# Patient Record
Sex: Male | Born: 1990 | Race: White | Hispanic: No | Marital: Single | Smoking: Former smoker
Health system: Southern US, Community
[De-identification: ages and names within clinical notes are randomized; demographics above are authoritative.]

## PROBLEM LIST (undated history)

## (undated) DIAGNOSIS — F419 Anxiety disorder, unspecified: Secondary | ICD-10-CM

## (undated) DIAGNOSIS — E785 Hyperlipidemia, unspecified: Secondary | ICD-10-CM

## (undated) HISTORY — DX: Anxiety disorder, unspecified: F41.9

## (undated) HISTORY — DX: Hyperlipidemia, unspecified: E78.5

---
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
## 2011-01-14 ENCOUNTER — Ambulatory Visit (HOSPITAL_COMMUNITY)
Admission: RE | Admit: 2011-01-14 | Discharge: 2011-01-14 | Disposition: A | Discharge status: Home / Self Care | Payer: Medicaid Other | Source: Ambulatory Visit | Attending: Family Medicine | Admitting: Family Medicine

## 2011-01-14 ENCOUNTER — Other Ambulatory Visit (HOSPITAL_COMMUNITY): Payer: Self-pay | Admitting: Family Medicine

## 2011-01-14 DIAGNOSIS — M545 Low back pain, unspecified: Secondary | ICD-10-CM | POA: Insufficient documentation

## 2011-03-20 ENCOUNTER — Telehealth: Payer: Self-pay | Admitting: Orthopedic Surgery

## 2011-03-20 ENCOUNTER — Emergency Department (HOSPITAL_COMMUNITY)
Admission: EM | Admit: 2011-03-20 | Discharge: 2011-03-20 | Disposition: A | Discharge status: Home / Self Care | Payer: Medicaid Other | Attending: Emergency Medicine | Admitting: Emergency Medicine

## 2011-03-20 ENCOUNTER — Emergency Department (HOSPITAL_COMMUNITY): Payer: Medicaid Other

## 2011-03-20 ENCOUNTER — Encounter: Payer: Self-pay | Admitting: *Deleted

## 2011-03-20 DIAGNOSIS — S43109A Unspecified dislocation of unspecified acromioclavicular joint, initial encounter: Secondary | ICD-10-CM | POA: Insufficient documentation

## 2011-03-20 DIAGNOSIS — F17200 Nicotine dependence, unspecified, uncomplicated: Secondary | ICD-10-CM | POA: Insufficient documentation

## 2011-03-20 MED ORDER — NAPROXEN 500 MG PO TABS: 500.0000 mg | ORAL_TABLET | Freq: Two times a day (BID) | ORAL | Status: AC

## 2011-03-20 NOTE — Telephone Encounter (Signed)
Patient and mother called to request appointment for evaluation of shoulder injury per visit at [facility] Emergency Room last evening.  Advised about insurance referral requirement per patient's insurance.  She contacted PCP Dr. Janna Arch, who has recommended follow up in their office first and referral can be made at that time.  Mom [phone].  Advised we will schedule first available upon receipt of referral.

## 2011-03-20 NOTE — ED Notes (Addendum)
Pt reports falling today on right shoulder.  Reports increasing pain through evening. Reporting some numbness.  Pt guarding shoulder and ROM decreased.

## 2011-03-20 NOTE — ED Notes (Signed)
Pt left the er stating no needs 

## 2011-03-20 NOTE — ED Provider Notes (Signed)
History     CSN: [id number] Arrival date & time: 03/20/2011  1:27 AM   None     Chief Complaint  Patient presents with  . Shoulder Pain    (Consider location/radiation/quality/duration/timing/severity/associated sxs/prior treatment) HPI Comments: Patient is a 20 year old male with a history of clavicle fracture on the right. He presents after a fall from his skateboard approximately 10 hours prior to arrival. He landed directly on the lateral surface of his right shoulder and has had constant pain in the shoulder since that time. Pain is ongoing, worse with palpation or range of motion of the shoulder, not associated with numbness weakness or change in color of his hand on the right. He had ibuprofen just prior to arrival without improvement.  Patient is a 20 y.o. male presenting with shoulder pain. The history is provided by the patient and a parent.  Shoulder Pain    History reviewed. No pertinent past medical history.  History reviewed. No pertinent past surgical history.  History reviewed. No pertinent family history.  History  Substance Use Topics  . Smoking status: Current Everyday Smoker -- 0.5 packs/day  . Smokeless tobacco: Not on file  . Alcohol Use: No      Review of Systems  Constitutional: Negative for fever.  Gastrointestinal: Negative for vomiting.  Skin: Negative for color change, rash and wound.       Laceration  Neurological: Negative for weakness and numbness.    Allergies  Amoxicillin and Penicillins  Home Medications   Current Outpatient Rx  Name Route Sig Dispense Refill  . NAPROXEN 500 MG PO TABS Oral Take 1 tablet (500 mg total) by mouth 2 (two) times daily. 30 tablet 0    BP 139/79  Pulse 89  Temp 98.6 F (37 C)  Resp 18  Ht 6' (1.829 m)  Wt 225 lb (102.059 kg)  BMI 30.52 kg/m2  SpO2 100%  Physical Exam  Nursing note and vitals reviewed. Constitutional: He appears well-developed and well-nourished. No distress.  HENT:  Head:  Normocephalic and atraumatic.  Eyes: Conjunctivae are normal. No scleral icterus.  Cardiovascular: Normal rate, regular rhythm and intact distal pulses.   Pulmonary/Chest: Effort normal and breath sounds normal.  Musculoskeletal: He exhibits tenderness ( Over the a.c. joint on the right. Decreased range of motion of the right shoulder secondary to pain at this location.). He exhibits no edema.       No palpable tenderness over the clavicle, lateral shoulder.  Neurological: He is alert.  Skin: Skin is warm and dry. No rash noted. He is not diaphoretic.    ED Course  Procedures (including critical care time)  Labs Reviewed - No data to display Dg Shoulder Right  03/20/2011  *RADIOLOGY REPORT*  Clinical Data: Fall  RIGHT SHOULDER - 2+ VIEW  Comparison: 12/03/2007  Findings: Fracture in the mid right clavicle seen on the prior study has healed with mild deformity.  No acute fracture.  Widening of the [facility] joints suggesting grade 1 AC separation.  This was not present previously.  IMPRESSION: Chronic healed fracture of the clavicle  Grade 1 AC separation.  Original Report Authenticated By: Camelia Phenes, M.D.     1. Separation of AC joint, type 1       MDM  Exam consistent with separated a.c. joint, imaging confirm this diagnosis, sling placed in the emergency department. Rice therapy prescribed for home.        Vida Roller, MD 03/20/11 [phone]

## 2011-03-27 ENCOUNTER — Encounter: Payer: Self-pay | Admitting: Orthopedic Surgery

## 2011-03-27 ENCOUNTER — Ambulatory Visit (INDEPENDENT_AMBULATORY_CARE_PROVIDER_SITE_OTHER): Payer: Medicaid Other | Admitting: Orthopedic Surgery

## 2011-03-27 VITALS — BP 120/70 | Ht 72.0 in | Wt 225.0 lb

## 2011-03-27 DIAGNOSIS — S43109A Unspecified dislocation of unspecified acromioclavicular joint, initial encounter: Secondary | ICD-10-CM

## 2011-03-27 MED ORDER — IBUPROFEN 800 MG PO TABS: 800.0000 mg | ORAL_TABLET | Freq: Three times a day (TID) | ORAL | Status: AC | PRN

## 2011-03-27 NOTE — Progress Notes (Signed)
Chief complaint pain, RIGHT shoulder.  Referral from the emergency room.  History 20 year old male fell on his RIGHT shoulder skateboarding complains of pain over the RIGHT shoulder.  Review of systems and wound negative except for joint pain.  Past history no medical problems, and no surgeries.  Exam normal development drumming and hygiene, normal vital signs.  Patient awake, alert, and oriented x3. Mood and affect. Normal.  RIGHT shoulder.  Tenderness over the a.c. Joint decreased range of motion and inability and ability to reach across the chest.  Reflexes are normal. Skin is intact. No major deformities good pulse in the RIGHT hand.  X-ray shows really no evidence of injury. Clinical diagnosis of a.c. Separation type I.  Immobilization for 10-12 days.  I gave him Codman exercises to start on Monday. We've stopped her Naprosyn put him on ibuprofen. Follow up as needed. He should continue to do his exercises until his range of motion returns

## 2011-03-27 NOTE — Patient Instructions (Signed)
Start exercises Monday

## 2012-05-17 ENCOUNTER — Emergency Department (HOSPITAL_COMMUNITY)
Admission: EM | Admit: 2012-05-17 | Discharge: 2012-05-17 | Disposition: A | Discharge status: Home / Self Care | Payer: Self-pay | Attending: Emergency Medicine | Admitting: Emergency Medicine

## 2012-05-17 ENCOUNTER — Encounter (HOSPITAL_COMMUNITY): Payer: Self-pay

## 2012-05-17 DIAGNOSIS — B349 Viral infection, unspecified: Secondary | ICD-10-CM

## 2012-05-17 DIAGNOSIS — R630 Anorexia: Secondary | ICD-10-CM | POA: Insufficient documentation

## 2012-05-17 DIAGNOSIS — J3489 Other specified disorders of nose and nasal sinuses: Secondary | ICD-10-CM | POA: Insufficient documentation

## 2012-05-17 DIAGNOSIS — B9789 Other viral agents as the cause of diseases classified elsewhere: Secondary | ICD-10-CM | POA: Insufficient documentation

## 2012-05-17 DIAGNOSIS — R5383 Other fatigue: Secondary | ICD-10-CM | POA: Insufficient documentation

## 2012-05-17 DIAGNOSIS — R51 Headache: Secondary | ICD-10-CM | POA: Insufficient documentation

## 2012-05-17 DIAGNOSIS — R5381 Other malaise: Secondary | ICD-10-CM | POA: Insufficient documentation

## 2012-05-17 DIAGNOSIS — R05 Cough: Secondary | ICD-10-CM | POA: Insufficient documentation

## 2012-05-17 DIAGNOSIS — R059 Cough, unspecified: Secondary | ICD-10-CM | POA: Insufficient documentation

## 2012-05-17 MED ORDER — OXYCODONE-ACETAMINOPHEN 5-325 MG PO TABS: 1.0000 | ORAL_TABLET | Freq: Four times a day (QID) | ORAL | Status: DC | PRN

## 2012-05-17 MED ORDER — PROMETHAZINE HCL 25 MG PO TABS: 25.0000 mg | ORAL_TABLET | Freq: Four times a day (QID) | ORAL | Status: DC | PRN

## 2012-05-17 NOTE — ED Provider Notes (Signed)
History     CSN: [id number]  Arrival date & time 05/17/12  1908   First MD Initiated Contact with Patient 05/17/12 2020      Chief Complaint  Patient presents with  . Sore Throat  . Headache  . Nasal Congestion    (Consider location/radiation/quality/duration/timing/severity/associated sxs/prior treatment) Patient is a 21 y.o. male presenting with pharyngitis and headaches. The history is provided by the patient.  Sore Throat This is a new problem. Associated symptoms include headaches. Pertinent negatives include no chest pain, no abdominal pain and no shortness of breath.  Headache  Pertinent negatives include no shortness of breath, no nausea and no vomiting.   patient has had sore throat cough and myalgias with headache for the last 2 days. He's had decreased appetite. She's had chills. He's had no numbness or weakness. He has had some mild nausea and some loose stools, but not frank diarrhea. Is no abdominal pain. He is otherwise healthy. He's had no hemoptysis with cough. He has had some yellowish sputum. Headache is dull and throbbing. No confusion.  History reviewed. No pertinent past medical history.  History reviewed. No pertinent past surgical history.  No family history on file.  History  Substance Use Topics  . Smoking status: Current Every Day Smoker -- 0.5 packs/day  . Smokeless tobacco: Not on file  . Alcohol Use: No      Review of Systems  Constitutional: Positive for appetite change and fatigue. Negative for activity change.  HENT: Positive for congestion and sore throat. Negative for neck pain and neck stiffness.   Eyes: Negative for pain.  Respiratory: Positive for cough. Negative for chest tightness and shortness of breath.   Cardiovascular: Negative for chest pain and leg swelling.  Gastrointestinal: Negative for nausea, vomiting, abdominal pain and diarrhea.  Genitourinary: Negative for flank pain.  Musculoskeletal: Negative for back pain.    Skin: Negative for rash.  Neurological: Positive for headaches. Negative for weakness and numbness.  Psychiatric/Behavioral: Negative for behavioral problems.    Allergies  Amoxicillin; Penicillins; and Latex  Home Medications   Current Outpatient Rx  Name  Route  Sig  Dispense  Refill  . TYLENOL COLD/FLU SEVERE DAY PO   Oral   Take 15-30 mLs by mouth daily as needed. For symptoms         . OXYCODONE-ACETAMINOPHEN 5-325 MG PO TABS   Oral   Take 1-2 tablets by mouth every 6 (six) hours as needed for pain.   15 tablet   0   . PROMETHAZINE HCL 25 MG PO TABS   Oral   Take 1 tablet (25 mg total) by mouth every 6 (six) hours as needed for nausea.   15 tablet   0     BP 153/84  Pulse 117  Temp 100.2 F (37.9 C) (Oral)  Resp 18  Ht 6\' 1"  (1.854 m)  Wt 210 lb (95.255 kg)  BMI 27.71 kg/m2  SpO2 100%  Physical Exam  Nursing note and vitals reviewed. Constitutional: He is oriented to person, place, and time. He appears well-developed and well-nourished.  HENT:  Head: Normocephalic and atraumatic.  Mouth/Throat: No oropharyngeal exudate.       Mild posterior pharyngeal erythema without exudate. Uvula is midline.  Eyes: Conjunctivae normal are normal.  Neck: Normal range of motion. Neck supple.  Cardiovascular: Regular rhythm and normal heart sounds.   No murmur heard.      Mild tachycardia  Pulmonary/Chest: Effort normal and breath sounds normal.  Abdominal: Soft. Bowel sounds are normal. He exhibits no distension and no mass. There is no tenderness. There is no rebound and no guarding.  Musculoskeletal: Normal range of motion. He exhibits no edema.  Lymphadenopathy:    He has cervical adenopathy.  Neurological: He is alert and oriented to person, place, and time. No cranial nerve deficit.  Skin: Skin is warm and dry.  Psychiatric: He has a normal mood and affect.    ED Course  Procedures (including critical care time)  Labs Reviewed - No data to display No  results found.   1. Viral infection       MDM  Patient presents with URI/viral symptoms for the last couple days. He has a tachycardia, but maintain blood pressure. He has a low-grade temperature of 100.2. Patient was offered IV fluids to improve the way he feels. He states he did not want this. He has a likely viral syndrome. He does not appear to be severely ill at this time. He was discharged home with pain medications and some nausea medications. He'll followup as needed. He was given a work note that he requested.        Juliet Rude. Rubin Payor, MD 05/17/12 2052

## 2012-05-17 NOTE — ED Notes (Signed)
Pt with cold like symptoms for 2 days. C/o sore throat, headache, and congestion for 2 days

## 2016-05-14 NOTE — H&P (Signed)
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 Malvin [name], after which the wound was left open and allowed to heal with daily packing changes. Patient previously noted recurrence of a tiny amount of clear drainage from the same area over the several weeks prior to his initial appointment. At that time, he denied any pain or fever/chills and describes only scant clear drainage, and he stated that it "doesn't really bother" him, but he requested referral to make sure it didn't require re-excision. Since that time, he says the scant drainage has increased somewhat, it's been bothering him more, and he'd like to have it re-excised.  Review of Symptoms:  Constitutional:No fevers, chills, or unexplained weight loss Head:Atraumatic; no masses; no abnormalities Eyes:No visual changes or eye pain Cardiovascular: No chest pain or palpitations.  Respiratory:No cough, shortness of breath or wheezing  Gastrointestin: No diarrhea, constipation, blood in stools, abdominal pain, vomiting or heartburn Musculoskeletal:No arthalgias, myalgias or joint swelling Integumentary: Recurrent NT sacrococcygeal minimally draining sinus as per HPI    Past Medical History:Reviewed  Past Medical History  Surgical History:  in-office excision of symptomatic (painful) and draining non-infected pilonidal cyst Derek [name]([place], 12/2014) Medical Problems:  pilonidal cyst Allergies:   seasonal, PCN Medications:   Chantix   Social History:Reviewed  Social History  Preferred Language: English Race:  White Ethnicity: Not Hispanic / Latino Age: [age] year Marital Status:  M Alcohol:  denies  Smoking Status: Former smoker reviewed on 05/14/2016 Started Date: 02/26/2006 Stopped Date:  01/18/2016  Functional Status ------------------------------------------------ Bathing: Normal Cooking: Normal Dressing: Normal Driving: Normal Eating: Normal Managing Meds: Normal Oral Care: Normal Shopping: Normal Toileting: Normal Transferring: Normal Walking: Normal  Cognitive Status ------------------------------------------------ Attention: Normal Decision Making: Normal Language: Normal Memory: Normal Motor: Normal Perception: Normal Problem Solving: Normal Visual and Spatial: Normal   Family History:Reviewed  Family Health History Mother, Living; Obesity;  Father, unknown  Vital Signs as of 05/14/2016:  Systolic 145: Diastolic 89: Heart Rate 86: Temp 36.5C (Temporal) Height 185CM: Weight 113.4KG: BMI 32.98 kg/m2  Physical Exam: General:Well-appearing, obese, well-nourished in no distress Skin: currently non-draining, NT 2 cm vertical length x 1 cm horizontal width sacrococcygeal pilonidal cyst with what appears to be 2-3 cm inferiorly a punctate non-draining sinus tract without erythema or purulent drainage, no rash or other prominent lesions Head:Atraumatic; no masses; no abnormalities Eyes:conjunctiva clear, EOM intact, PERRL Heart:RRR, no murmur Lungs:CTA bilaterally, no wheezes, rhonchi, rales.  Breathing unlabored. Abdomen: Soft, NT/ND, no HSM, no masses. Extremities:No deformities, clubbing, cyanosis, or edema.   Assessment: 25 year old obese Male with symptomatic (draining) recurrent non-infected 2 cm vertical length x 1 cm horizontal width sacrococcygeal pilonidal cyst with what appears to be 2-3 cm inferiorly a punctate non-draining sinus tract 1 year s/p excision by another surgeon.  Plan:      - all risks, benefits, and alternatives to re-excision of pilonidal cyst and sinus tract discussed with patient (and his mother, also present), all of patient's questions were answered to his expressed satisfaction, patient agrees to proceed  with surgery, and informed consent was accordingly obtained      - will apply wound vac at time of surgery, and also discussed with patient was that he may choose alternatively to change daily wet-to-dry dressings      - patient scheduled for elective re-excision of  symptomatic and draining recurrent sacrococcygeal pilonidal cyst and sinus tract      - surgical follow-up 2 weeks after planned procedure  -- Derek CowerJason E. Earlene Plateravis, MD, RPVI [place]: [facility] Office #: [phone]

## 2016-05-16 NOTE — Patient Instructions (Signed)
Derek [name]  05/16/2016     @PREFPERIOPPHARMACY @   Your procedure is scheduled on  05/23/2016   Report to [name] at  615  A.M.  Call this number if you have problems the morning of surgery:  [phone]   Remember:  Do not eat food or drink liquids after midnight.  Take these medicines the morning of surgery with A SIP OF WATER  none   Do not wear jewelry, make-up or nail polish.  Do not wear lotions, powders, or perfumes, or deoderant.  Do not shave 48 hours prior to surgery.  Men may shave face and neck.  Do not bring valuables to the hospital.  [facility] is not responsible for any belongings or valuables.  Contacts, dentures or bridgework may not be worn into surgery.  Leave your suitcase in the car.  After surgery it may be brought to your room.  For patients admitted to the hospital, discharge time will be determined by your treatment team.  Patients discharged the day of surgery will not be allowed to drive home.   Name and phone number of your driver:   family Special instructions:  none  Please read over the following fact sheets that you were given. Anesthesia Post-op Instructions and Care and Recovery After Surgery      Vacuum-Assisted Closure Therapy Vacuum-assisted closure (VAC) therapy uses a device that removes fluid and germs from wounds to help them heal. It is used on wounds that cannot be closed with stitches. They often heal slowly. Vacuum-assisted therapy helps the wound stay clean and healthy while the open wound slowly grows back together. Vacuum-assisted closure therapy uses a bandage (dressing) that is made of foam. It is put inside the wound. Then, a drape is placed over the wound. This drape sticks to your skin to keep air out, and to protect the wound. A tube is hooked up to a small pump and is attached to the drape. The pump sucks out the fluid and germs. Vacuum-assisted closure therapy can also help reduce the bad  smell that comes from the wound. HOW DOES IT WORK?  The vacuum pump pulls fluid through the foam dressing. The dressing may wrinkle during this process. The fluid goes into the tube and away from the wound. The fluid then goes into a container. The fluid in the container must be replaced if it is full or at least once a week, even if the container is not full. The pulling from the pump helps to close the wound and bring better circulation to the wound area. The foam dressing covers and protects the wound. It helps your wound heal faster.  HOW DOES IT FEEL?   You might feel a little pulling when the pump is on.  You might also feel a mild vibrating sensation.  You might feel some discomfort when the dressing is taken off. CAN I MOVE AROUND WITH VACUUM-ASSISTED CLOSURE THERAPY? Yes, it has a backup battery which is used when the machine is not plugged in, as long as the battery is working, you can move freely. WHAT ARE SOME THINGS I MUST KNOW?  Do not turn off the pump yourself, unless instructed to do so by your healthcare provider, such as for bathing.  Do not take off the dressing yourself, unless instructed to do so by your caregiver.  You can wash or shower with the dressing. However, do not take the  pump into the shower. Make sure the wound dressing is protected and covered with plastic. The wound area must stay dry.  Do not turn off the pump for more than 2 hours. If the pump is off for more than 2 hours, your nurse must change your dressing.  Check frequently that the machine is on, that the machine indicates the therapy is on, and that all clamps are open. THE ALARM IS SOUNDING! WHAT SHOULD I DO?   Stay calm.  Do not turn off the pump or do anything with the dressing.  Call your clinic or caregiver right away if the alarm goes off and you cannot fix the problem. Some reasons the alarm might go off include:  The fluid collection container is full.  The battery is low.  The  dressing has a leak.  Explain to your caregiver what is happening. Follow the instructions you receive. WHEN SHOULD I CALL FOR HELP?   You have severe pain.  You have difficulty breathing.  You have bleeding that will not stop.  Your wound smells bad.  You have redness, swelling, or fluid leaking from your wound.  Your alarm goes off and you do not know what to do.  You have a fever.  Your wound itches severely.  Your dressing changes are often painful or bleeding often occurs.  You have diarrhea.  You have a sore throat.  You have a rash around the dressing or anywhere else on your body.  You feel nauseous.  You feel dizzy or weak.  [facility] has been off for more than 2 hours. HOW DO I GET READY TO GO HOME WITH A PUMP?  A trained caregiver will talk to you and answer your questions about your vacuum-assisted closure therapy before you go home. He or she will explain what to expect. A caregiver will come to your home to apply the pump and care for your wound. The at-home caregiver will be available for questions and will come back for the scheduled dressing changes, usually every 48-72 hours (or more often for severely infected wounds). Your at-home caregiver will also come if you are having an unexpected problem. If you have questions or do not know what to do when you go home, talk to your healthcare provider. This information is not intended to replace advice given to you by your health care provider. Make sure you discuss any questions you have with your health care provider. Document Released: 04/17/2008 Document Revised: 01/05/2013 Document Reviewed: 02/08/2015 Elsevier Interactive Patient Education  2017 [employer] Vacuum-Assisted Closure Therapy Home Guide Vacuum-assisted closure therapy [facility](VAC therapy) is a device that helps wounds heal. It is used on wounds that cannot be closed with stitches. They often heal slowly. VAC therapy helps the wound stay clean  and healthy while its edges slowly grow back together. VAC therapy uses a bandage (dressing) that is made of foam. It is put inside the wound. Then, a drape is placed over the wound. This drape sticks to your skin (adhesive) to keep air out. A tube is hooked up to a small pump and is attached to the drape. The pump sucks fluid and germs from the wound. It can also decrease any bad smell that comes from the wound. RISKS AND COMPLICATIONS VAC therapy is usually safe to use at home. Your skin may get sore from the adhesive drape. That is the most common problem. However, more serious problems can develop, such as:   Bleeding. This can  happen if the dressing in the wound comes into contact with blood vessels. A little bleeding may occur when the dressing is being changed. This is normal now and then. Major bleeding can happen if a large blood vessel breaks. This is more likely if you are taking blood-thinning medicine. Emergency surgery may be needed.  Infection. This can happen if the dressing has an air leak that is not repaired within a couple of hours.  Dehydration. This can happen if the pump sucks out too much body fluid. DRESSING CHANGES Your dressing will have to be changed. Sometimes this is needed once a day. Other times, a dressing change must be done 3 times a week. How often you change your dressing will depend on what your wound is like. A trained caregiver will most likely change the dressing. However, a family member or friend may be trained to change the dressing. Below are steps to change a dressing in order to prevent an infection. The steps apply to you or the person that changes your dressing.  Wash your hands with soap and water before and after each dressing change.  Wear gloves and protective clothing. This may include eye protection.  Do not allow anyone to change your dressing if they have an infection or a skin condition. Even a small cut can be a problem. To change the  dressing:   Turn off the pump.  Take off the adhesive drape.  Disconnect the tube from the dressing.  Take out the dressing that is inside the wound. If the dressing sticks, use a germ-free (sterile), saltwater solution to wet the dressing. This helps it come out more easily. If it hurts when the dressing is changed, take pain medicine 30 minutes before the dressing change.  Cleanse the wound with normal saline or sterile water.  Apply a skin barrier film to the skin that will be covered with the drape. This will protect the skin.  Put a new dressing into the wound.  Apply a new drape and tube.  Replace the container in the pump that collects fluid if it is full. Do this at least once per week.  Turn the pump back on.  Your doctor will decide what setting of suction is best. Do not change the settings on the machine without talking to your nurse or doctor. HOME CARE INSTRUCTIONS   The VAC pump has an alarm. It goes off if there are any problems such as a leak.  Ask your caregiver what to do if the alarm goes off.  Call your caregiver right away if the alarm goes off and you cannot fix the problem.  Do not turn off the pump for more than 2 hours.  Check your wound carefully at each dressing change for signs of infection. Watch for redness, swelling, or any fluid leaking from the wound. If you develop an infection:  You may have to stop VAC therapy.  The wound will need to be cleaned and washed out.  You will have to take antibiotic medicine.  Ask your caregiver what activities you should or should not do while you are getting VAC therapy. This will depend on your particular wound.  Ask if it is okay to turn off the pump so you can take a shower. If it is okay, make sure the wound is covered with plastic. The wound area must stay dry.  Drink enough fluids to keep your urine clear or pale yellow.  Eat foods that contain a lot  of protein. Examples are meat, poultry,  seafood, eggs, nuts, beans, and peas. Protein can help your wound heal. SEEK MEDICAL CARE IF:  Your wound itches or hurts.  Dressing changes are often painful or bleeding often occurs.  You have a headache.  You have diarrhea.  You have a sore throat.  You have a rash.  You feel nauseous.  You feel dizzy or weak. SEEK IMMEDIATE MEDICAL CARE IF:   You have very bad pain.  You have bleeding that will not stop.  Your wound smells bad.  You have redness, swelling, or fluid leaking from your wound.  Your alarm goes off and you do not know what to do.  You have a fever. This information is not intended to replace advice given to you by your health care provider. Make sure you discuss any questions you have with your health care provider. Document Released: 07/28/2011 Document Reviewed: 02/08/2015 Elsevier Interactive Patient Education  2017 [employer] Incision and Drainage of a Pilonidal Cyst Introduction Incision and drainage is a surgical procedure to open and drain a fluid-filled sac that forms around a hair follicle in the tailbone area between your buttocks (pilonidal cyst). You may need this procedure if the cyst becomes painful, swollen, or infected. There are three types of procedures that may be done. The type of procedure you have depends on the size and severity of your infected cyst. The procedure may be:  Incision and drainage with a special type of bandage (wound packing). Packing is used for wounds that are deep or tunnel under the skin.  Marsupialization. In this procedure, the cyst will be opened and kept open. The edges of the incision will be stitched together to make a pocket.  Incision and drainage without wound packing. Tell a health care provider about:  Any allergies you have.  All medicines you are taking, including vitamins, herbs, eye drops, creams, and over-the-counter medicines.  Any problems you or family members have had with anesthetic  medicines.  Any blood disorders you have.  Any surgeries you have had.  Any medical conditions you have. What are the risks? Generally, this is a safe procedure. However, problems can occur and include:  Infection.  Bleeding.  Having another cyst develop.  Need for more surgery. What happens before the procedure?  Ask your health care provider about:  Changing or stopping your regular medicines. This is especially important if you are taking diabetes medicines or blood thinners.  Taking medicines such as aspirin and ibuprofen. These medicines can thin your blood. Do not take these medicines before your procedure if your health care provider tells you not to.  Taking antibiotics before surgery to control the infection.  Do not eat or drink anything for 6?8 hours before the procedure if you are having general anesthesia.  Take a shower the night before the procedure to clean your buttocks area. Take another shower in the morning before surgery.  Plan to have someone take you home after the procedure. What happens during the procedure?  You will have an IV tube inserted in a vein in your hand or arm.  You will be given one of the following:  A medicine that numbs the area (local anesthetic).  A medicine that makes you go to sleep (general anesthetic).  You also may be given medicine to help you relax during the procedure (sedative).  You will lie face down on the operating table.  Your buttocks area may be shaved.  Tape may  be used to spread your buttocks.  Germ-killing solution (antiseptic) may be used to clean the area. Incision and Drainage With Wound Packing:  Your surgeon will make a surgical cut (incision) over the cyst to open it.  A probe may be used to see if there are tunnels extending away from the cyst under your skin.  Fluid or pus inside the cyst will be drained.  The cyst will be flushed out with a germ-free (sterile) solution.  Packing will be  placed into the open cyst. This keeps it open and draining after surgery.  The area will be covered with a bandage (dressing). Marsupialization:  Your surgeon will make a surgical cut (incision) over the cyst to open it.  A probe may be used to see if there are tunnels extending away from the cyst under your skin.  Fluid or pus inside the cyst will be drained.  The cyst will be flushed out with a germ-free (sterile) solution.  The edges of the incision will be stitched (sutured) to the skin to keep it wide open. The cyst will not be packed.  A rolled-up bandage (dressing) will be taped over the incision. Incision and Drainage Without Packing:  Your surgeon will make a surgical cut (incision) over the cyst to open it.  A probe may be used to see if there are tunnels extending away from the cyst under your skin.  Fluid or pus inside the cyst will be drained.  The cyst will be flushed out with a germ-free (sterile) solution.  Your surgeon may also remove the tissue around the opened cyst.  The incision then will be closed with stitches (sutures). It will not be left open, and packing will not be used.  A bandage (dressing) will be put over the incision area. What happens after the procedure?  If you had general anesthesia, you will be taken to a recovery area. Your blood pressure, heart rate, breathing rate, and blood oxygen level will be monitored often until the medicines you were given have worn off.  It is normal to have some pain after this procedure. You may be given pain medicine.  Your IV tube can be taken out after you have recovered and your pain is under control. This information is not intended to replace advice given to you by your health care provider. Make sure you discuss any questions you have with your health care provider. Document Released: 11/30/2013 Document Revised: 10/11/2015 Document Reviewed: 09/22/2013  2017 Elsevier Incision and Drainage of a  Pilonidal Cyst, Care After Introduction Refer to this sheet in the next few weeks. These instructions provide you with information on caring for yourself after your procedure. Your health care provider may also give you more specific instructions. Your treatment has been planned according to current medical practices, but problems sometimes occur. Call your health care provider if you have any problems or questions after your procedure. What can I expect after the procedure? After your procedure, it is typical to have the following:  Pain near or at the surgical area.  Blood-tinged discharge on your wound packing or your bandage (dressing). Follow these instructions at home:  Take medicines only as directed by your health care provider.  If you were prescribed an antibiotic medicine, finish it all even if you start to feel better.  To prevent constipation:  Drink enough fluid to keep your urine clear or pale yellow.  Include lots of whole grains, fruits, and vegetables in your diet.  Do  not do activities that irritate or put pressure on your buttocks for about 2 weeks or as directed by your health care provider. These include bike riding, running, and anything that involves a twisting motion.  Do not sit for long periods of time.  Sleep on your side instead of your back.  Ask your health care provider when you can return to work and resume your usual activities.  Wear loose, cotton underwear.  Keep all follow-up visits as directed by your health care provider. This is important. If you had a surgical cut (incision) and drainage with wound packing:  Return to your health care provider as instructed to have your packing changed or removed.  Keep the incision area dry until your packing has been removed.  After the packing has been removed, you can start taking showers or baths.  Clean your buttocks area with soap and water.  Pat the area dry with a soft, clean towel. If you  had a marsupialization procedure:  You can start taking showers or baths the day after surgery.  Let the water from the shower or bath moisten your dressing before you remove it.  After your shower or bath, pat your buttocks area dry with a soft, clean towel and replace your dressing.  Ask your health care provider:  When you can stop using a dressing.  When you can start taking showers or baths. If you had a surgical cut (incision) and drainage without packing:  Follow instructions from your health care provider about how to take care of your incision. Make sure you:  Wash your hands with soap and water before you change your bandage (dressing). If soap and water are not available, use hand sanitizer.  Change your dressing as told by your health care provider.  Leave stitches (sutures), skin glue, or adhesive strips in place. These skin closures may need to stay in place for 2 weeks or longer. If adhesive strip edges start to loosen and curl up, you may trim the loose edges. Do not remove adhesive strips completely unless your health care provider tells you to do that. Contact a health care provider if:  Your incision is bleeding.  You have signs of infection at your incision or around the incision. Watch for:  Drainage.  Redness.  Swelling.  Pain.  There is a bad smell coming from your incision site.  Your pain medicine is not helping.  You have a fever or chills.  You have muscles aches.  You are dizzy.  You feel generally ill. This information is not intended to replace advice given to you by your health care provider. Make sure you discuss any questions you have with your health care provider. Document Released: 06/05/2006 Document Revised: 10/11/2015 Document Reviewed: 09/22/2013  2017 Elsevier PATIENT INSTRUCTIONS POST-ANESTHESIA  IMMEDIATELY FOLLOWING SURGERY:  Do not drive or operate machinery for the first twenty four hours after surgery.  Do not make  any important decisions for twenty four hours after surgery or while taking narcotic pain medications or sedatives.  If you develop intractable nausea and vomiting or a severe headache please notify your doctor immediately.  FOLLOW-UP:  Please make an appointment with your surgeon as instructed. You do not need to follow up with anesthesia unless specifically instructed to do so.  WOUND CARE INSTRUCTIONS (if applicable):  Keep a dry clean dressing on the anesthesia/puncture wound site if there is drainage.  Once the wound has quit draining you may leave it open to air.  Generally you should leave the bandage intact for twenty four hours unless there is drainage.  If the epidural site drains for more than 36-48 hours please call the anesthesia department.  QUESTIONS?:  Please feel free to call your physician or the hospital operator if you have any questions, and they will be happy to assist you.

## 2016-05-21 ENCOUNTER — Encounter (HOSPITAL_COMMUNITY): Payer: Self-pay

## 2016-05-21 ENCOUNTER — Encounter (HOSPITAL_COMMUNITY)
Admission: RE | Admit: 2016-05-21 | Discharge: 2016-05-21 | Disposition: A | Discharge status: Home / Self Care | Payer: Medicaid Other | Source: Ambulatory Visit | Attending: Surgery | Admitting: Surgery

## 2016-05-21 DIAGNOSIS — Z01818 Encounter for other preprocedural examination: Secondary | ICD-10-CM | POA: Insufficient documentation

## 2016-05-21 LAB — CBC WITH DIFFERENTIAL/PLATELET
BASOS PCT: 1 %
Basophils Absolute: 0.1 10*3/uL (ref 0.0–0.1)
Eosinophils Absolute: 0.3 10*3/uL (ref 0.0–0.7)
Eosinophils Relative: 3 %
HEMATOCRIT: 47.6 % (ref 39.0–52.0)
HEMOGLOBIN: 16.2 g/dL (ref 13.0–17.0)
Lymphocytes Relative: 36 %
Lymphs Abs: 3.1 10*3/uL (ref 0.7–4.0)
MCH: 29.8 pg (ref 26.0–34.0)
MCHC: 34 g/dL (ref 30.0–36.0)
MCV: 87.5 fL (ref 78.0–100.0)
MONOS PCT: 8 %
Monocytes Absolute: 0.7 10*3/uL (ref 0.1–1.0)
NEUTROS ABS: 4.4 10*3/uL (ref 1.7–7.7)
NEUTROS PCT: 52 %
Platelets: 324 10*3/uL (ref 150–400)
RBC: 5.44 MIL/uL (ref 4.22–5.81)
RDW: 12.6 % (ref 11.5–15.5)
WBC: 8.4 10*3/uL (ref 4.0–10.5)

## 2016-05-23 ENCOUNTER — Encounter (HOSPITAL_COMMUNITY): Payer: Self-pay | Admitting: *Deleted

## 2016-05-23 ENCOUNTER — Ambulatory Visit (HOSPITAL_COMMUNITY): Payer: Self-pay | Admitting: Anesthesiology

## 2016-05-23 ENCOUNTER — Encounter (HOSPITAL_COMMUNITY)
Admission: RE | Disposition: A | Discharge status: Home / Self Care | Payer: Self-pay | Source: Ambulatory Visit | Attending: Surgery

## 2016-05-23 ENCOUNTER — Ambulatory Visit (HOSPITAL_COMMUNITY)
Admission: RE | Admit: 2016-05-23 | Discharge: 2016-05-23 | Disposition: A | Discharge status: Home / Self Care | Payer: Self-pay | Source: Ambulatory Visit | Attending: Surgery | Admitting: Surgery

## 2016-05-23 DIAGNOSIS — Z880 Allergy status to penicillin: Secondary | ICD-10-CM | POA: Insufficient documentation

## 2016-05-23 DIAGNOSIS — Z6832 Body mass index (BMI) 32.0-32.9, adult: Secondary | ICD-10-CM | POA: Insufficient documentation

## 2016-05-23 DIAGNOSIS — L0591 Pilonidal cyst without abscess: Secondary | ICD-10-CM | POA: Insufficient documentation

## 2016-05-23 DIAGNOSIS — Z87891 Personal history of nicotine dependence: Secondary | ICD-10-CM | POA: Insufficient documentation

## 2016-05-23 DIAGNOSIS — E669 Obesity, unspecified: Secondary | ICD-10-CM | POA: Insufficient documentation

## 2016-05-23 DIAGNOSIS — K219 Gastro-esophageal reflux disease without esophagitis: Secondary | ICD-10-CM | POA: Insufficient documentation

## 2016-05-23 DIAGNOSIS — R000 Tachycardia, unspecified: Secondary | ICD-10-CM | POA: Insufficient documentation

## 2016-05-23 HISTORY — PX: PILONIDAL CYST EXCISION: SHX744

## 2016-05-23 SURGERY — EXCISION, PILONIDAL CYST, EXTENSIVE
Anesthesia: General | Site: Coccyx

## 2016-05-23 MED ORDER — GLYCOPYRROLATE 0.2 MG/ML IJ SOLN
INTRAMUSCULAR | Status: AC
Start: 2016-05-23 — End: 2016-05-23
  Filled 2016-05-23: qty 1

## 2016-05-23 MED ORDER — METHYLENE BLUE 0.5 % INJ SOLN
INTRAVENOUS | Status: DC | PRN
  Administered 2016-05-23: 3 mL via SUBMUCOSAL

## 2016-05-23 MED ORDER — MIDAZOLAM HCL 2 MG/2ML IJ SOLN
INTRAMUSCULAR | Status: AC
  Filled 2016-05-23: qty 2

## 2016-05-23 MED ORDER — POVIDONE-IODINE 10 % EX OINT
TOPICAL_OINTMENT | CUTANEOUS | Status: AC
  Filled 2016-05-23: qty 1

## 2016-05-23 MED ORDER — ONDANSETRON HCL 4 MG/2ML IJ SOLN
4.0000 mg | Freq: Once | INTRAMUSCULAR | Status: AC
  Administered 2016-05-23: 4 mg via INTRAVENOUS

## 2016-05-23 MED ORDER — ROCURONIUM BROMIDE 50 MG/5ML IV SOLN
INTRAVENOUS | Status: AC
  Filled 2016-05-23: qty 1

## 2016-05-23 MED ORDER — CHLORHEXIDINE GLUCONATE CLOTH 2 % EX PADS: 6.0000 | MEDICATED_PAD | Freq: Once | CUTANEOUS | Status: DC

## 2016-05-23 MED ORDER — METOPROLOL TARTRATE 5 MG/5ML IV SOLN
5.0000 mg | Freq: Once | INTRAVENOUS | Status: AC
  Administered 2016-05-23: 2 mg via INTRAVENOUS

## 2016-05-23 MED ORDER — ONDANSETRON HCL 4 MG/2ML IJ SOLN
INTRAMUSCULAR | Status: AC
  Filled 2016-05-23: qty 2

## 2016-05-23 MED ORDER — FENTANYL CITRATE (PF) 100 MCG/2ML IJ SOLN
INTRAMUSCULAR | Status: AC
  Filled 2016-05-23: qty 2

## 2016-05-23 MED ORDER — LIDOCAINE HCL 1 % IJ SOLN
INTRAMUSCULAR | Status: DC | PRN
  Administered 2016-05-23: 20 mL

## 2016-05-23 MED ORDER — BUPIVACAINE HCL (PF) 0.5 % IJ SOLN
INTRAMUSCULAR | Status: AC
  Filled 2016-05-23: qty 30

## 2016-05-23 MED ORDER — GLYCOPYRROLATE 0.2 MG/ML IJ SOLN
INTRAMUSCULAR | Status: DC | PRN
  Administered 2016-05-23: 0.4 mg via INTRAVENOUS

## 2016-05-23 MED ORDER — LACTATED RINGERS IV SOLN
INTRAVENOUS | Status: DC
  Administered 2016-05-23: 1000 mL via INTRAVENOUS

## 2016-05-23 MED ORDER — OXYCODONE-ACETAMINOPHEN 5-325 MG PO TABS: 1.0000 | ORAL_TABLET | ORAL | 0 refills | Status: DC | PRN

## 2016-05-23 MED ORDER — VANCOMYCIN HCL IN DEXTROSE 1-5 GM/200ML-% IV SOLN
1000.0000 mg | INTRAVENOUS | Status: AC
  Administered 2016-05-23: 1000 mg via INTRAVENOUS
  Filled 2016-05-23: qty 200

## 2016-05-23 MED ORDER — FENTANYL CITRATE (PF) 100 MCG/2ML IJ SOLN
INTRAMUSCULAR | Status: DC | PRN
  Administered 2016-05-23: 25 ug via INTRAVENOUS
  Administered 2016-05-23 (×2): 50 ug via INTRAVENOUS

## 2016-05-23 MED ORDER — METOPROLOL TARTRATE 5 MG/5ML IV SOLN
INTRAVENOUS | Status: AC
  Filled 2016-05-23: qty 5

## 2016-05-23 MED ORDER — HYDROMORPHONE HCL 1 MG/ML IJ SOLN: 0.2500 mg | INTRAMUSCULAR | Status: DC | PRN

## 2016-05-23 MED ORDER — GLYCOPYRROLATE 0.2 MG/ML IJ SOLN
INTRAMUSCULAR | Status: AC
  Filled 2016-05-23: qty 1

## 2016-05-23 MED ORDER — LIDOCAINE HCL (CARDIAC) 10 MG/ML IV SOLN
INTRAVENOUS | Status: DC | PRN
  Administered 2016-05-23: 50 mg via INTRAVENOUS

## 2016-05-23 MED ORDER — METHYLENE BLUE 0.5 % INJ SOLN
INTRAVENOUS | Status: AC
  Filled 2016-05-23: qty 10

## 2016-05-23 MED ORDER — SODIUM CHLORIDE 0.9 % IJ SOLN
INTRAMUSCULAR | Status: AC
  Filled 2016-05-23: qty 10

## 2016-05-23 MED ORDER — NEOSTIGMINE METHYLSULFATE 10 MG/10ML IV SOLN
INTRAVENOUS | Status: DC | PRN
  Administered 2016-05-23: 2 mg via INTRAVENOUS

## 2016-05-23 MED ORDER — PROPOFOL 10 MG/ML IV BOLUS
INTRAVENOUS | Status: DC | PRN
  Administered 2016-05-23: 180 mg via INTRAVENOUS
  Administered 2016-05-23: 20 mg via INTRAVENOUS
  Administered 2016-05-23: 50 mg via INTRAVENOUS

## 2016-05-23 MED ORDER — ROCURONIUM BROMIDE 100 MG/10ML IV SOLN
INTRAVENOUS | Status: DC | PRN
  Administered 2016-05-23: 5 mg via INTRAVENOUS
  Administered 2016-05-23: 15 mg via INTRAVENOUS

## 2016-05-23 MED ORDER — SUCCINYLCHOLINE CHLORIDE 20 MG/ML IJ SOLN
INTRAMUSCULAR | Status: DC | PRN
  Administered 2016-05-23: 140 mg via INTRAVENOUS

## 2016-05-23 MED ORDER — SUCCINYLCHOLINE CHLORIDE 20 MG/ML IJ SOLN
INTRAMUSCULAR | Status: AC
  Filled 2016-05-23: qty 1

## 2016-05-23 MED ORDER — PROPOFOL 10 MG/ML IV BOLUS
INTRAVENOUS | Status: AC
  Filled 2016-05-23: qty 40

## 2016-05-23 MED ORDER — 0.9 % SODIUM CHLORIDE (POUR BTL) OPTIME
TOPICAL | Status: DC | PRN
  Administered 2016-05-23: 1000 mL

## 2016-05-23 MED ORDER — MIDAZOLAM HCL 2 MG/2ML IJ SOLN
1.0000 mg | INTRAMUSCULAR | Status: DC | PRN
  Administered 2016-05-23 (×2): 2 mg via INTRAVENOUS
  Filled 2016-05-23: qty 2

## 2016-05-23 MED ORDER — LIDOCAINE HCL (PF) 1 % IJ SOLN
INTRAMUSCULAR | Status: AC
  Filled 2016-05-23: qty 5

## 2016-05-23 MED ORDER — METHYLENE BLUE 0.5 % INJ SOLN
10.0000 mL | Freq: Once | INTRAVENOUS | Status: DC
Start: 2016-05-23 — End: 2016-05-23

## 2016-05-23 MED ORDER — NEOSTIGMINE METHYLSULFATE 10 MG/10ML IV SOLN
INTRAVENOUS | Status: AC
  Filled 2016-05-23: qty 1

## 2016-05-23 SURGICAL SUPPLY — 23 items
BAG HAMPER (MISCELLANEOUS) ×3 IMPLANT
CLOTH BEACON ORANGE TIMEOUT ST (SAFETY) ×3 IMPLANT
COVER LIGHT HANDLE STERIS (MISCELLANEOUS) ×6 IMPLANT
DECANTER SPIKE VIAL GLASS SM (MISCELLANEOUS) ×6 IMPLANT
ELECT REM PT RETURN 9FT ADLT (ELECTROSURGICAL) ×3
ELECTRODE REM PT RTRN 9FT ADLT (ELECTROSURGICAL) ×1 IMPLANT
GLOVE BIOGEL PI IND STRL 7.0 (GLOVE) ×1 IMPLANT
GLOVE BIOGEL PI INDICATOR 7.0 (GLOVE) ×2
GLOVE ECLIPSE 6.5 STRL STRAW (GLOVE) ×3 IMPLANT
GLOVE SURG SS PI 7.5 STRL IVOR (GLOVE) ×3 IMPLANT
GOWN STRL REUS W/ TWL XL LVL3 (GOWN DISPOSABLE) ×1 IMPLANT
GOWN STRL REUS W/TWL LRG LVL3 (GOWN DISPOSABLE) ×9 IMPLANT
GOWN STRL REUS W/TWL XL LVL3 (GOWN DISPOSABLE) ×3
KIT ROOM TURNOVER APOR (KITS) ×3 IMPLANT
MANIFOLD NEPTUNE II (INSTRUMENTS) ×3 IMPLANT
NEEDLE HYPO 18GX1.5 BLUNT FILL (NEEDLE) ×3 IMPLANT
NS IRRIG 1000ML POUR BTL (IV SOLUTION) ×3 IMPLANT
PACK MINOR (CUSTOM PROCEDURE TRAY) ×3 IMPLANT
PAD ABD 5X9 TENDERSORB (GAUZE/BANDAGES/DRESSINGS) ×3 IMPLANT
PAD ARMBOARD 7.5X6 YLW CONV (MISCELLANEOUS) ×3 IMPLANT
SET BASIN LINEN APH (SET/KITS/TRAYS/PACK) ×3 IMPLANT
SPONGE LAP 18X18 X RAY DECT (DISPOSABLE) ×3 IMPLANT
SYRINGE 10CC LL (SYRINGE) ×3 IMPLANT

## 2016-05-23 NOTE — Anesthesia Procedure Notes (Signed)
Procedure Name: Intubation Date/Time: 05/23/2016 7:44 AM Performed by: Franco NonesYATES, Jametta Moorehead S Pre-anesthesia Checklist: Patient identified, Patient being monitored, Timeout performed, Emergency Drugs available and Suction available Patient Re-evaluated:Patient Re-evaluated prior to inductionOxygen Delivery Method: Circle System Utilized Preoxygenation: Pre-oxygenation with 100% oxygen Intubation Type: IV induction, Rapid sequence and Cricoid Pressure applied Ventilation: Mask ventilation without difficulty Laryngoscope Size: Miller and 3 Grade View: Grade II Tube type: Oral Tube size: 7.0 mm Number of attempts: 1 Airway Equipment and Method: Stylet Placement Confirmation: ETT inserted through vocal cords under direct vision,  positive ETCO2 and breath sounds checked- equal and bilateral Secured at: 23 cm Tube secured with: Tape Dental Injury: Teeth and Oropharynx as per pre-operative assessment

## 2016-05-23 NOTE — Transfer of Care (Signed)
Immediate Anesthesia Transfer of Care Note  Patient: Derek BeckmannBryan L [name]  Procedure(s) Performed: Procedure(s): CYST EXCISION PILONIDAL EXTENSIVE (N/A)  Patient Location: PACU  Anesthesia Type:General  Level of Consciousness: awake and patient cooperative  Airway & Oxygen Therapy: Patient Spontanous Breathing and Patient connected to nasal cannula oxygen  Post-op Assessment: Report given to RN and Post -op Vital signs reviewed and stable  Post vital signs: Reviewed and stable  Last Vitals:  Vitals:   05/23/16 0725 05/23/16 0730  BP: 129/69 121/74  Pulse:    Resp: 15 16  Temp:      Last Pain:  Vitals:   05/23/16 0644  TempSrc: Oral      Patients Stated Pain Goal: 6 (05/23/16 0644)  Complications: No apparent anesthesia complications

## 2016-05-23 NOTE — Op Note (Signed)
SURGICAL OPERATIVE REPORT  DATE OF PROCEDURE: 05/23/2016  ATTENDING Surgeon(s): Ancil LinseyJason Evan Madelynne Lasker, MD  ANESTHESIA: general   PRE-OPERATIVE DIAGNOSIS: Non-infected symptomatic (painful and draining) recurrent extensive sacrococcygeal pilonidal cyst and multiple sinus tract(s) without abcess (L05.92)  POST-OPERATIVE DIAGNOSIS: Non-infected symptomatic (painful and draining) recurrent extensive sacrococcygeal pilonidal cyst and multiple sinus tract(s) without abcess (L05.92)  PROCEDURE(S):  1.) Excision of extensive pilonidal cyst and sinus (cpt: 11771) 2.) Application of negative pressure wound dressing  INTRAOPERATIVE FINDINGS: 9 cm long x 6 cm wide x 8.5 cm deep wound in order to incorporate all probed and methylene-blue stained multiple visualized sinus tracts, negative pressure dressing with seal intact to suction  INTRAVENOUS FLUIDS: 650 mL crystalloid   ESTIMATED BLOOD LOSS: Minimal (<30 mL)   URINE OUTPUT: No Foley  SPECIMENS: None  IMPLANTS: Negative pressure wound dressing sponge  DRAINS: Negative pressure wound dressing with sponge  COMPLICATIONS: None apparent  CONDITION AT END OF PROCEDURE: Hemodynamically stable and extubated  DISPOSITION OF PATIENT: PACU  INDICATIONS FOR PROCEDURE:  Patient is a 26 y.o. male who presented with a not apparently infected recurrent symptomatic (painful and draining) pilonidal cyst and visible sinus tract. Operative and non-operative management options were discussed, and patient expressed wishes to have the pilonidal cyst and sinus tract(s) excised. All risks, benefits, and alternatives to above procedure were discussed with the patient, all of patient's questions were answered to his expressed satisfaction, and informed consent was accordingly obtained and documented at that time.  DETAILS OF PROCEDURE: Patient was brought to the operating suite and appropriately identified. General anesthesia was administered along with  appropriate pre-operative antibiotics, and endotracheal intubation was performed by anesthetist. In position with his Right side down, secured using a deflatable bean bag, all pressure points were appropriately padded, buttocks were gently retracted using tape, and the operative site was prepped and draped in the usual sterile fashion. Following a brief time out, visible sinus tracts were identified, probed, and injected with methylene blue, which was observed to communicate with other sinus tract(s) and the primary lesion. An initial 9 cm x 6 cm elliptical incision was made to encompass the pilonidal cyst and all visualized sinus tracts using a #15 blade scalpel and extended deep through subcutaneous tissues using electrocautery. The entire tract was excised down to normal tissue deep to the sinus tract, and all blue-stained tissue was likewise excised in its entirety.  Hemostasis was achieved with electrocautery and absorbable suture as needed. The resulting wound was then measured and documented, and the sponge for a negative pressure dressing was cut to the same measurements and applied to the wound. Surrounding skin was then cleaned and dried, and the remainder of the negative pressure dressing adhesive film and tubing were applied and connected, using a foam bridge across patient's back to his Right side in order to minimize sacral pressure points, and negative pressure dressing suction integrity was verified. Patient was then safely able to be extubated, awakened, and transferred to PACU for post-operative monitoring and care.  I was present for all aspects of the above procedure, and no complications were apparent.

## 2016-05-23 NOTE — Interval H&P Note (Signed)
History and Physical Interval Note:  05/23/2016 7:27 AM  Derek BeckmannBryan L Finnan  has presented today for surgery, with the diagnosis of pilonidal cyst  The various methods of treatment have been discussed with the patient and family. After consideration of risks, benefits and other options for treatment, the patient has consented to  Procedure(s): CYST EXCISION PILONIDAL EXTENSIVE (N/A) as a surgical intervention .  The patient's history has been reviewed, patient examined, no change in status, stable for surgery.  I have reviewed the patient's chart and labs.  Questions were answered to the patient's satisfaction.     Ancil LinseyJason Evan Atzel [place]

## 2016-05-23 NOTE — Discharge Instructions (Addendum)
In addition to included general post-operative instructions for Excision of Pilonidal Cyst and Sinus,  Diet: Resume home heart healthy diet.   Activity: No heavy lifting >20 pounds (children, pets, laundry, garbage) or strenuous activity until follow-up, but light activity and walking are encouraged. Do not drive or drink alcohol if taking narcotic pain medications.  Wound care: Negative pressure dressing is ordered to be changed 3x per week by home health nursing. Must keep negative pressure dressing clean and dry when applied.   Medications: Resume all home medications. For mild to moderate pain: acetaminophen (Tylenol) or ibuprofen (if no kidney disease). Narcotic pain medications, if prescribed, can be used for severe pain, though may cause nausea, constipation, and drowsiness. Do not combine Tylenol and Percocet within a 6 hour period as Percocet contains Tylenol. If you do not need the narcotic pain medication, you do not need to fill the prescription.  Call office [phone]) at any time if any questions, worsening pain, fevers/chills, bleeding, drainage from incision site, or other concerns.     Incision and Drainage of a Pilonidal Cyst, Care After Introduction Refer to this sheet in the next few weeks. These instructions provide you with information on caring for yourself after your procedure. Your health care provider may also give you more specific instructions. Your treatment has been planned according to current medical practices, but problems sometimes occur. Call your health care provider if you have any problems or questions after your procedure. What can I expect after the procedure? After your procedure, it is typical to have the following:  Pain near or at the surgical area.  Blood-tinged discharge on your wound packing or your bandage (dressing). Follow these instructions at home:  Take medicines only as directed by your health care provider.  If you were prescribed  an antibiotic medicine, finish it all even if you start to feel better.  To prevent constipation:  Drink enough fluid to keep your urine clear or pale yellow.  Include lots of whole grains, fruits, and vegetables in your diet.  Do not do activities that irritate or put pressure on your buttocks for about 2 weeks or as directed by your health care provider. These include bike riding, running, and anything that involves a twisting motion.  Do not sit for long periods of time.  Sleep on your side instead of your back.  Ask your health care provider when you can return to work and resume your usual activities.  Wear loose, cotton underwear.  Keep all follow-up visits as directed by your health care provider. This is important. If you had a surgical cut (incision) and drainage with wound packing:  Return to your health care provider as instructed to have your packing changed or removed.  Keep the incision area dry until your packing has been removed.  After the packing has been removed, you can start taking showers or baths.  Clean your buttocks area with soap and water.  Pat the area dry with a soft, clean towel. If you had a marsupialization procedure:  You can start taking showers or baths the day after surgery.  Let the water from the shower or bath moisten your dressing before you remove it.  After your shower or bath, pat your buttocks area dry with a soft, clean towel and replace your dressing.  Ask your health care provider:  When you can stop using a dressing.  When you can start taking showers or baths. If you had a surgical cut (incision) and  drainage without packing:  Follow instructions from your health care provider about how to take care of your incision. Make sure you:  Wash your hands with soap and water before you change your bandage (dressing). If soap and water are not available, use hand sanitizer.  Change your dressing as told by your health care  provider.  Leave stitches (sutures), skin glue, or adhesive strips in place. These skin closures may need to stay in place for 2 weeks or longer. If adhesive strip edges start to loosen and curl up, you may trim the loose edges. Do not remove adhesive strips completely unless your health care provider tells you to do that. Contact a health care provider if:  Your incision is bleeding.  You have signs of infection at your incision or around the incision. Watch for:  Drainage.  Redness.  Swelling.  Pain.  There is a bad smell coming from your incision site.  Your pain medicine is not helping.  You have a fever or chills.  You have muscles aches.  You are dizzy.  You feel generally ill. This information is not intended to replace advice given to you by your health care provider. Make sure you discuss any questions you have with your health care provider. Document Released: 06/05/2006 Document Revised: 10/11/2015 Document Reviewed: 09/22/2013  2017 Elsevier   PATIENT INSTRUCTIONS POST-ANESTHESIA  IMMEDIATELY FOLLOWING SURGERY:  Do not drive or operate machinery for the first twenty four hours after surgery.  Do not make any important decisions for twenty four hours after surgery or while taking narcotic pain medications or sedatives.  If you develop intractable nausea and vomiting or a severe headache please notify your doctor immediately.  FOLLOW-UP:  Please make an appointment with your surgeon as instructed. You do not need to follow up with anesthesia unless specifically instructed to do so.  WOUND CARE INSTRUCTIONS (if applicable):  Keep a dry clean dressing on the anesthesia/puncture wound site if there is drainage.  Once the wound has quit draining you may leave it open to air.  Generally you should leave the bandage intact for twenty four hours unless there is drainage.  If the epidural site drains for more than 36-48 hours please call the anesthesia  department.  QUESTIONS?:  Please feel free to call your physician or the hospital operator if you have any questions, and they will be happy to assist you.

## 2016-05-23 NOTE — Anesthesia Preprocedure Evaluation (Signed)
Anesthesia Evaluation  Patient identified by MRN, date of birth, ID band Patient awake    Reviewed: Allergy & Precautions, NPO status , Patient's Chart, lab work & pertinent test results  Airway Mallampati: I  TM Distance: >3 FB     Dental  (+) Teeth Intact   Pulmonary former smoker,    breath sounds clear to auscultation       Cardiovascular hypertension (borderline),  Rhythm:Regular Rate:Tachycardia     Neuro/Psych    GI/Hepatic GERD  ,  Endo/Other    Renal/GU      Musculoskeletal   Abdominal   Peds  Hematology   Anesthesia Other Findings   Reproductive/Obstetrics                             Anesthesia Physical Anesthesia Plan  ASA: II  Anesthesia Plan: General   Post-op Pain Management:    Induction: Intravenous, Rapid sequence and Cricoid pressure planned  Airway Management Planned: Oral ETT  Additional Equipment:   Intra-op Plan:   Post-operative Plan: Extubation in OR  Informed Consent: I have reviewed the patients History and Physical, chart, labs and discussed the procedure including the risks, benefits and alternatives for the proposed anesthesia with the patient or authorized representative who has indicated his/her understanding and acceptance.     Plan Discussed with:   Anesthesia Plan Comments:         Anesthesia Quick Evaluation

## 2016-05-23 NOTE — Anesthesia Postprocedure Evaluation (Signed)
Anesthesia Post Note  Patient: Derek [name]  Procedure(s) Performed: Procedure(s) (LRB): CYST EXCISION PILONIDAL EXTENSIVE (N/A)  Patient location during evaluation: PACU Anesthesia Type: General Level of consciousness: awake and alert Pain management: pain level controlled Vital Signs Assessment: post-procedure vital signs reviewed and stable Respiratory status: spontaneous breathing and patient connected to nasal cannula oxygen Cardiovascular status: stable Anesthetic complications: no     Last Vitals:  Vitals:   05/23/16 0915 05/23/16 0930  BP: 115/75 124/81  Pulse: 76 73  Resp: (!) 29 12  Temp: 36.9 C     Last Pain:  Vitals:   05/23/16 0644  TempSrc: Oral                 Prestin Munch

## 2016-05-26 ENCOUNTER — Encounter (HOSPITAL_COMMUNITY): Payer: Self-pay | Admitting: Surgery

## 2016-07-22 ENCOUNTER — Encounter: Payer: Self-pay | Admitting: General Surgery

## 2016-07-22 ENCOUNTER — Ambulatory Visit (INDEPENDENT_AMBULATORY_CARE_PROVIDER_SITE_OTHER): Payer: Self-pay | Admitting: General Surgery

## 2016-07-22 VITALS — BP 157/87 | HR 110 | Temp 98.2°F | Resp 20 | Ht 73.0 in | Wt 249.0 lb

## 2016-07-22 DIAGNOSIS — Z09 Encounter for follow-up examination after completed treatment for conditions other than malignant neoplasm: Secondary | ICD-10-CM

## 2016-07-22 NOTE — Progress Notes (Signed)
Subjective:     Oren BeckmannBryan L Woody status post excision of pilonidal cyst with closure by secondary intention. He is doing well. Has no complaints. No drainage noted. No fevers noted.  Objective:    BP (!) 157/87   Pulse (!) 110   Temp 98.2 F (36.8 C)   Resp 20   Ht 6\' 1"  (1.854 m)   Wt 249 lb (112.9 kg)   BMI 32.85 kg/m   General:  alert, cooperative and no distress  Pilonidal incision has healed. No drainage or erythema noted.     Assessment:    Doing well postoperatively.    Plan:Return if any complications arise. Follow-up as needed.

## 2017-10-10 ENCOUNTER — Emergency Department (HOSPITAL_COMMUNITY)
Admission: EM | Admit: 2017-10-10 | Discharge: 2017-10-10 | Disposition: A | Discharge status: Home / Self Care | Payer: Managed Care, Other (non HMO) | Attending: Emergency Medicine | Admitting: Emergency Medicine

## 2017-10-10 ENCOUNTER — Emergency Department (HOSPITAL_COMMUNITY): Payer: Managed Care, Other (non HMO)

## 2017-10-10 ENCOUNTER — Other Ambulatory Visit: Payer: Self-pay

## 2017-10-10 DIAGNOSIS — Z91040 Latex allergy status: Secondary | ICD-10-CM | POA: Diagnosis not present

## 2017-10-10 DIAGNOSIS — Y999 Unspecified external cause status: Secondary | ICD-10-CM | POA: Diagnosis not present

## 2017-10-10 DIAGNOSIS — Y9389 Activity, other specified: Secondary | ICD-10-CM | POA: Diagnosis not present

## 2017-10-10 DIAGNOSIS — Y929 Unspecified place or not applicable: Secondary | ICD-10-CM | POA: Diagnosis not present

## 2017-10-10 DIAGNOSIS — Z87891 Personal history of nicotine dependence: Secondary | ICD-10-CM | POA: Insufficient documentation

## 2017-10-10 DIAGNOSIS — S42022A Displaced fracture of shaft of left clavicle, initial encounter for closed fracture: Secondary | ICD-10-CM | POA: Diagnosis not present

## 2017-10-10 DIAGNOSIS — S4992XA Unspecified injury of left shoulder and upper arm, initial encounter: Secondary | ICD-10-CM | POA: Diagnosis present

## 2017-10-10 MED ORDER — IBUPROFEN 600 MG PO TABS: 600.0000 mg | ORAL_TABLET | Freq: Four times a day (QID) | ORAL | 0 refills | Status: DC | PRN

## 2017-10-10 MED ORDER — HYDROCODONE-ACETAMINOPHEN 5-325 MG PO TABS: ORAL_TABLET | ORAL | 0 refills | Status: DC

## 2017-10-10 NOTE — Discharge Instructions (Addendum)
Keep your left arm in a sling.  You may apply ice packs on and off to help reduce swelling.  Call 1 of the orthopedic providers listed on Tuesday to arrange a follow-up appointment.
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
## 2017-10-14 ENCOUNTER — Encounter: Payer: Self-pay | Admitting: Orthopaedic Surgery

## 2017-10-14 ENCOUNTER — Ambulatory Visit (INDEPENDENT_AMBULATORY_CARE_PROVIDER_SITE_OTHER): Payer: Managed Care, Other (non HMO) | Admitting: Orthopaedic Surgery

## 2017-10-14 VITALS — BP 155/115 | HR 93 | Temp 97.3°F | Ht 73.0 in | Wt 243.0 lb

## 2017-10-14 DIAGNOSIS — S42032A Displaced fracture of lateral end of left clavicle, initial encounter for closed fracture: Secondary | ICD-10-CM | POA: Diagnosis not present

## 2017-10-14 MED ORDER — HYDROCODONE-ACETAMINOPHEN 5-325 MG PO TABS: ORAL_TABLET | ORAL | 0 refills | Status: DC
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
## 2017-10-20 ENCOUNTER — Telehealth: Payer: Self-pay | Admitting: Orthopaedic Surgery

## 2017-10-20 NOTE — Telephone Encounter (Signed)
Called patient (had previously left a reminder message 10/16/17) regarding the process of form completion - aware to bring back the [employer] form packet with fee noted. Relayed that his short-term disability carrier, [employer], has also called our office regarding status - said he will come to office tomorrow to take care of it.  Aware forms need to be filled out by [employer] rep, then reviewed and signed by Dr.  We have relayed this information to [employer], ph [phone].

## 2017-10-28 ENCOUNTER — Encounter: Payer: Self-pay | Admitting: Orthopaedic Surgery

## 2017-10-28 ENCOUNTER — Ambulatory Visit (INDEPENDENT_AMBULATORY_CARE_PROVIDER_SITE_OTHER): Payer: Managed Care, Other (non HMO)

## 2017-10-28 ENCOUNTER — Ambulatory Visit (INDEPENDENT_AMBULATORY_CARE_PROVIDER_SITE_OTHER): Payer: Self-pay | Admitting: Orthopaedic Surgery

## 2017-10-28 VITALS — Ht 73.0 in | Wt 243.0 lb

## 2017-10-28 DIAGNOSIS — S42032D Displaced fracture of lateral end of left clavicle, subsequent encounter for fracture with routine healing: Secondary | ICD-10-CM

## 2017-10-28 NOTE — Progress Notes (Signed)
CC:  My collar bone is better  He has less pain of the left clavicle fracture.  He is using his sling.  He has no new trauma.  X-rays were done of the left clavicle, reported separately.  NV intact.   Encounter Diagnosis  Name Primary?  . Closed displaced fracture of acromial end of left clavicle with routine healing, subsequent encounter Yes   I will see him in two weeks.  X-rays then on return.  Call if any problem.  Precautions discussed.   Electronically Signed Darreld McleanWayne Raliyah Montella, MD 6/12/20199:07 AM

## 2017-11-11 ENCOUNTER — Ambulatory Visit (INDEPENDENT_AMBULATORY_CARE_PROVIDER_SITE_OTHER): Payer: 59

## 2017-11-11 ENCOUNTER — Encounter: Payer: Self-pay | Admitting: Orthopaedic Surgery

## 2017-11-11 ENCOUNTER — Ambulatory Visit (INDEPENDENT_AMBULATORY_CARE_PROVIDER_SITE_OTHER): Payer: 59 | Admitting: Orthopaedic Surgery

## 2017-11-11 DIAGNOSIS — S42032D Displaced fracture of lateral end of left clavicle, subsequent encounter for fracture with routine healing: Secondary | ICD-10-CM

## 2017-11-11 DIAGNOSIS — S42021D Displaced fracture of shaft of right clavicle, subsequent encounter for fracture with routine healing: Secondary | ICD-10-CM

## 2017-11-11 NOTE — Patient Instructions (Signed)

## 2017-11-11 NOTE — Progress Notes (Signed)
CC:  My collar bone is not hurting that much  He has had fracture of the left mid shaft clavicle.  He has been using his sling.  He has no new trauma.  NV intact.  ROM of the left shoulder is good.  Impression:  Fracture midshaft of left clavicle displaced.  X-rays were done today, reported separately.  He may come out of sling as tolerated.  Return in two weeks.  X-rays on return.  Call if any problem.  Precautions discussed.   Electronically Signed Darreld McleanWayne Jett Fukuda, MD 6/26/20199:32 AM

## 2017-11-25 ENCOUNTER — Ambulatory Visit (INDEPENDENT_AMBULATORY_CARE_PROVIDER_SITE_OTHER): Payer: Self-pay

## 2017-11-25 ENCOUNTER — Ambulatory Visit: Payer: 59 | Admitting: Orthopaedic Surgery

## 2017-11-25 ENCOUNTER — Encounter: Payer: Self-pay | Admitting: Orthopaedic Surgery

## 2017-11-25 VITALS — BP 135/86 | HR 79 | Temp 98.3°F | Ht 71.0 in | Wt 245.0 lb

## 2017-11-25 DIAGNOSIS — S42022D Displaced fracture of shaft of left clavicle, subsequent encounter for fracture with routine healing: Secondary | ICD-10-CM

## 2017-11-25 DIAGNOSIS — S42021D Displaced fracture of shaft of right clavicle, subsequent encounter for fracture with routine healing: Secondary | ICD-10-CM

## 2017-11-25 NOTE — Progress Notes (Signed)
CC:  My shoulder does not hurt  He has good motion of the left shoulder and no pain. He has "knot" present over the fracture site.  NV intact.    X-rays were done of the left clavicle today, reported separately.  Encounter Diagnosis  Name Primary?  . Displaced fracture of shaft of right clavicle, subsequent encounter for fracture with routine healing Yes   Continue activity.  Return in one month.  X-rays on return.  He has very little callus.  Precautions given.  Call if any problem.  Precautions discussed.   Electronically Signed Darreld McleanWayne Anivea Velasques, MD 7/10/20199:35 AM

## 2017-12-23 ENCOUNTER — Ambulatory Visit (INDEPENDENT_AMBULATORY_CARE_PROVIDER_SITE_OTHER): Payer: Self-pay

## 2017-12-23 ENCOUNTER — Encounter: Payer: Self-pay | Admitting: Orthopaedic Surgery

## 2017-12-23 ENCOUNTER — Ambulatory Visit: Payer: Self-pay | Admitting: Orthopaedic Surgery

## 2017-12-23 DIAGNOSIS — S42022D Displaced fracture of shaft of left clavicle, subsequent encounter for fracture with routine healing: Secondary | ICD-10-CM

## 2017-12-23 NOTE — Patient Instructions (Signed)
Steps to Quit Smoking Smoking tobacco can be bad for your health. It can also affect almost every organ in your body. Smoking puts you and people around you at risk for many serious long-lasting (chronic) diseases. Quitting smoking is hard, but it is one of the best things that you can do for your health. It is never too late to quit. What are the benefits of quitting smoking? When you quit smoking, you lower your risk for getting serious diseases and conditions. They can include:  Lung cancer or lung disease.  Heart disease.  Stroke.  Heart attack.  Not being able to have children (infertility).  Weak bones (osteoporosis) and broken bones (fractures).  If you have coughing, wheezing, and shortness of breath, those symptoms may get better when you quit. You may also get sick less often. If you are pregnant, quitting smoking can help to lower your chances of having a baby of low birth weight. What can I do to help me quit smoking? Talk with your doctor about what can help you quit smoking. Some things you can do (strategies) include:  Quitting smoking totally, instead of slowly cutting back how much you smoke over a period of time.  Going to in-person counseling. You are more likely to quit if you go to many counseling sessions.  Using resources and support systems, such as: ? Online chats with a counselor. ? Phone quitlines. ? Printed self-help materials. ? Support groups or group counseling. ? Text messaging programs. ? Mobile phone apps or applications.  Taking medicines. Some of these medicines may have nicotine in them. If you are pregnant or breastfeeding, do not take any medicines to quit smoking unless your doctor says it is okay. Talk with your doctor about counseling or other things that can help you.  Talk with your doctor about using more than one strategy at the same time, such as taking medicines while you are also going to in-person counseling. This can help make  quitting easier. What things can I do to make it easier to quit? Quitting smoking might feel very hard at first, but there is a lot that you can do to make it easier. Take these steps:  Talk to your family and friends. Ask them to support and encourage you.  Call phone quitlines, reach out to support groups, or work with a counselor.  Ask people who smoke to not smoke around you.  Avoid places that make you want (trigger) to smoke, such as: ? Bars. ? Parties. ? Smoke-break areas at work.  Spend time with people who do not smoke.  Lower the stress in your life. Stress can make you want to smoke. Try these things to help your stress: ? Getting regular exercise. ? Deep-breathing exercises. ? Yoga. ? Meditating. ? Doing a body scan. To do this, close your eyes, focus on one area of your body at a time from head to toe, and notice which parts of your body are tense. Try to relax the muscles in those areas.  Download or buy apps on your mobile phone or tablet that can help you stick to your quit plan. There are many free apps, such as QuitGuide from the CDC (Centers for Disease Control and Prevention). You can find more support from smokefree.gov and other websites.  This information is not intended to replace advice given to you by your health care provider. Make sure you discuss any questions you have with your health care provider. Document Released: 03/01/2009 Document   Revised: 01/01/2016 Document Reviewed: 09/19/2014 Elsevier Interactive Patient Education  2018 Elsevier Inc.  

## 2017-12-23 NOTE — Progress Notes (Signed)
CC:  I am much better  He wants to return to work.  He has no pain.  NV intact.  He has full ROM and no pain of the left clavicle or shoulder.  X-rays were done, reported separately.  Encounter Diagnosis  Name Primary?  . Closed displaced fracture of shaft of left clavicle with routine healing, subsequent encounter Yes   I will see as needed.  Forms for work completed.  Call if any problem.  Precautions discussed.   Electronically Signed Darreld McleanWayne Jaidy Cottam, MD 8/7/20199:23 AM

## 2018-08-13 ENCOUNTER — Emergency Department (HOSPITAL_COMMUNITY)
Admission: EM | Admit: 2018-08-13 | Discharge: 2018-08-13 | Disposition: A | Discharge status: Home / Self Care | Payer: Managed Care, Other (non HMO) | Attending: Emergency Medicine | Admitting: Emergency Medicine

## 2018-08-13 ENCOUNTER — Encounter (HOSPITAL_COMMUNITY): Payer: Self-pay | Admitting: Emergency Medicine

## 2018-08-13 ENCOUNTER — Other Ambulatory Visit: Payer: Self-pay

## 2018-08-13 DIAGNOSIS — R05 Cough: Secondary | ICD-10-CM | POA: Diagnosis present

## 2018-08-13 DIAGNOSIS — F17200 Nicotine dependence, unspecified, uncomplicated: Secondary | ICD-10-CM | POA: Insufficient documentation

## 2018-08-13 DIAGNOSIS — B9789 Other viral agents as the cause of diseases classified elsewhere: Secondary | ICD-10-CM | POA: Diagnosis not present

## 2018-08-13 DIAGNOSIS — J069 Acute upper respiratory infection, unspecified: Secondary | ICD-10-CM | POA: Diagnosis not present

## 2018-08-13 DIAGNOSIS — Z91040 Latex allergy status: Secondary | ICD-10-CM | POA: Diagnosis not present

## 2018-08-13 NOTE — ED Triage Notes (Signed)
Patient complaining of cough and congestion since yesterday.

## 2018-08-13 NOTE — ED Provider Notes (Addendum)
[facility] EMERGENCY DEPARTMENT Provider Note   CSN: [id number] Arrival date & time: 08/13/18  1104    History   Chief Complaint Chief Complaint  Patient presents with  . Cough    HPI Derek [name] is a 28 y.o. male.     HPI  28 year old male presents with mild cough.  Started having a sore/scratchy throat a couple days ago but was [name] mild.  Notes a little bit of cough but occasionally has some clear sputum starting yesterday.  He has not had any fever or felt like a fever.  No dyspnea.  Mild frontal headache.  He sneezed at work and asked to go home.  They told him he had to come to the ER to be tested for COVID-19.  He does not specifically have any known contact with anyone with COVID-19.  History reviewed. No pertinent past medical history.  Patient Active Problem List   Diagnosis Date Noted  . Closed dislocation of acromioclavicular (joint) 03/27/2011  . Fracture of clavicle, left, closed 12/07/2007    Past Surgical History:  Procedure Laterality Date  . PILONIDAL CYST EXCISION N/A 05/23/2016   Procedure: CYST EXCISION PILONIDAL EXTENSIVE;  Surgeon: Ancil Linsey, MD;  Location: [facility];  Service: General;  Laterality: N/A;        Home Medications    Prior to Admission medications   Medication Sig Start Date End Date Taking? Authorizing Provider  ibuprofen (ADVIL,MOTRIN) 600 MG tablet Take 1 tablet (600 mg total) by mouth every 6 (six) hours as needed. Patient not taking: Reported on 12/23/2017 10/10/17   Pauline Aus, PA-C    Family History Family History  Problem Relation Age of Onset  . Hypertension Mother   . Cancer Father     Social History Social History   Tobacco Use  . Smoking status: Current Some Day Smoker    Packs/day: 0.50    Years: 10.00    Pack years: 5.00    Last attempt to quit: 01/20/2016    Years since quitting: 2.5  . Smokeless tobacco: Former Neurosurgeon    Quit date: 05/21/2013  Substance Use Topics  . Alcohol use: No  . Drug  use: Yes    Types: Marijuana     Allergies   Penicillins; Amoxicillin; and Latex   Review of Systems Review of Systems  Constitutional: Negative for fever.  HENT: Positive for sore throat.   Respiratory: Positive for cough. Negative for shortness of breath.   Neurological: Positive for headaches.  All other systems reviewed and are negative.    Physical Exam Updated Vital Signs BP (!) 151/91 (BP Location: Right Arm)   Pulse (!) 109   Temp 99.3 F (37.4 C) (Oral)   Resp 18   Ht 6\' 1"  (1.854 m)   Wt 113.4 kg   SpO2 97%   BMI 32.98 kg/m   Physical Exam Vitals signs and nursing note reviewed.  Constitutional:      General: He is not in acute distress.    Appearance: He is well-developed. He is obese. He is not ill-appearing or diaphoretic.  HENT:     Head: Normocephalic and atraumatic.     Right Ear: External ear normal.     Left Ear: External ear normal.     Nose: Nose normal.  Eyes:     General:        Right eye: No discharge.        Left eye: No discharge.  Neck:  Musculoskeletal: Neck supple.  Cardiovascular:     Rate and Rhythm: Normal rate and regular rhythm.     Heart sounds: Normal heart sounds.  Pulmonary:     Effort: Pulmonary effort is normal.     Breath sounds: Normal breath sounds. No wheezing.  Abdominal:     Palpations: Abdomen is soft.     Tenderness: There is no abdominal tenderness.  Skin:    General: Skin is warm and dry.  Neurological:     Mental Status: He is alert.  Psychiatric:        Mood and Affect: Mood is not anxious.      ED Treatments / Results  Labs (all labs ordered are listed, but only abnormal results are displayed) Labs Reviewed - No data to display  EKG None  Radiology No results found.  Procedures Procedures (including critical care time)  Medications Ordered in ED Medications - No data to display   Initial Impression / Assessment and Plan / ED Course  I have reviewed the triage vital signs and  the nursing notes.  Pertinent labs & imaging results that were available during my care of the patient were reviewed by me and considered in my medical decision making (see chart for details).        Patient appears to have a mild viral URI.  In this current pandemic it is reasonable to consider that he might have COVID-19.  He could also have other mild viral URI.  Per protocol, at this time he has no immunocompromise or other alarming risk factors that would warrant he needs specific testing.  However I discussed he will need to quarantine himself and be off work.  He has been given guidelines for this.  From a respiratory standpoint, no increased WOB, hypoxia or abnormal lung sounds, thus no CXR indicated. Otherwise we discussed return precautions but he appears quite well currently.  Derek [name]  was evaluated in Emergency Department on 08/13/2018 for the symptoms described in the history of present illness. He/she was evaluated in the context of the global COVID-19 pandemic, which necessitated consideration that the patient might be at risk for infection with the SARS-CoV-2 virus that causes COVID-19. Institutional protocols and algorithms that pertain to the evaluation of patients at risk for COVID-19 are in a state of rapid change based on information released by regulatory bodies including the CDC and federal and state organizations. These policies and algorithms were followed during the patient's care in the ED.   Final Clinical Impressions(s) / ED Diagnoses   Final diagnoses:  Viral upper respiratory illness    ED Discharge Orders    None       Pricilla Loveless, MD 08/13/18 1204    Pricilla Loveless, MD 08/13/18 [phone]

## 2018-08-13 NOTE — Discharge Instructions (Addendum)
Person Under Monitoring Name: Derek [name]  Location: [address] [place] [address]   Infection Prevention Recommendations for Individuals Confirmed to have, or Being Evaluated for, 2019 Novel Coronavirus (COVID-19) Infection Who Receive Care at Home  Individuals who are confirmed to have, or are being evaluated for, COVID-19 should follow the prevention steps below until a healthcare provider or local or state health department says they can return to normal activities.  Stay home except to get medical care You should restrict activities outside your home, except for getting medical care. Do not go to work, school, or public areas, and do not use public transportation or taxis.  Call ahead before visiting your doctor Before your medical appointment, call the healthcare provider and tell them that you have, or are being evaluated for, COVID-19 infection. This will help the healthcare providers office take steps to keep other people from getting infected. Ask your healthcare provider to call the local or state health department.  Monitor your symptoms Seek prompt medical attention if your illness is worsening (e.g., difficulty breathing). Before going to your medical appointment, call the healthcare provider and tell them that you have, or are being evaluated for, COVID-19 infection. Ask your healthcare provider to call the local or state health department.  Wear a facemask You should wear a facemask that covers your nose and mouth when you are in the same room with other people and when you visit a healthcare provider. People who live with or visit you should also wear a facemask while they are in the same room with you.  Separate yourself from other people in your home As much as possible, you should stay in a different room from other people in your home. Also, you should use a separate bathroom, if available.  Avoid sharing household items You should not share  dishes, drinking glasses, cups, eating utensils, towels, bedding, or other items with other people in your home. After using these items, you should wash them thoroughly with soap and water.  Cover your coughs and sneezes Cover your mouth and nose with a tissue when you cough or sneeze, or you can cough or sneeze into your sleeve. Throw used tissues in a lined trash can, and immediately wash your hands with soap and water for at least 20 seconds or use an alcohol-based hand rub.  Wash your [employer] your hands often and thoroughly with soap and water for at least 20 seconds. You can use an alcohol-based hand sanitizer if soap and water are not available and if your hands are not visibly dirty. Avoid touching your eyes, nose, and mouth with unwashed hands.   Prevention Steps for Caregivers and Household Members of Individuals Confirmed to have, or Being Evaluated for, COVID-19 Infection Being Cared for in the Home  If you live with, or provide care at home for, a person confirmed to have, or being evaluated for, COVID-19 infection please follow these guidelines to prevent infection:  Follow healthcare providers instructions Make sure that you understand and can help the patient follow any healthcare provider instructions for all care.  Provide for the patients basic needs You should help the patient with basic needs in the home and provide support for getting groceries, prescriptions, and other personal needs.  Monitor the patients symptoms If they are getting sicker, call his or her medical provider and tell them that the patient has, or is being evaluated for, COVID-19 infection. This will help the healthcare providers office  take steps to keep other people from getting infected. °Ask the healthcare provider to call the local or state health department. ° °Limit the number of people who have contact with the patient °If possible, have only one caregiver for the patient. °Other  household members should stay in another home or place of residence. If this is not possible, they should stay °in another room, or be separated from the patient as much as possible. Use a separate bathroom, if available. °Restrict visitors who do not have an essential need to be in the home. ° °Keep older adults, very young children, and other sick people away from the patient °Keep older adults, very young children, and those who have compromised immune systems or chronic health conditions away from the patient. This includes people with chronic heart, lung, or kidney conditions, diabetes, and cancer. ° °Ensure good ventilation °Make sure that shared spaces in the home have good air flow, such as from an air conditioner or an opened window, °weather permitting. ° °Wash your hands often °Wash your hands often and thoroughly with soap and water for at least 20 seconds. You can use an alcohol based hand sanitizer if soap and water are not available and if your hands are not visibly dirty. °Avoid touching your eyes, nose, and mouth with unwashed hands. °Use disposable paper towels to dry your hands. If not available, use dedicated cloth towels and replace them when they become wet. ° °Wear a facemask and gloves °Wear a disposable facemask at all times in the room and gloves when you touch or have contact with the patient’s blood, body fluids, and/or secretions or excretions, such as sweat, saliva, sputum, nasal mucus, vomit, urine, or feces.  Ensure the mask fits over your nose and mouth tightly, and do not touch it during use. °Throw out disposable facemasks and gloves after using them. Do not reuse. °Wash your hands immediately after removing your facemask and gloves. °If your personal clothing becomes contaminated, carefully remove clothing and launder. Wash your hands after handling contaminated clothing. °Place all used disposable facemasks, gloves, and other waste in a lined container before disposing them with  other household waste. °Remove gloves and wash your hands immediately after handling these items. ° °Do not share dishes, glasses, or other household items with the patient °Avoid sharing household items. You should not share dishes, drinking glasses, cups, eating utensils, towels, bedding, or other items with a patient who is confirmed to have, or being evaluated for, COVID-19 infection. °After the person uses these items, you should wash them thoroughly with soap and water. ° °Wash laundry thoroughly °Immediately remove and wash clothes or bedding that have blood, body fluids, and/or secretions or excretions, such as sweat, saliva, sputum, nasal mucus, vomit, urine, or feces, on them. °Wear gloves when handling laundry from the patient. °Read and follow directions on labels of laundry or clothing items and detergent. In general, wash and dry with the warmest temperatures recommended on the label. ° °Clean all areas the individual has used often °Clean all touchable surfaces, such as counters, tabletops, doorknobs, bathroom fixtures, toilets, phones, keyboards, tablets, and bedside tables, every day. Also, clean any surfaces that may have blood, body fluids, and/or secretions or excretions on them. °Wear gloves when cleaning surfaces the patient has come in contact with. °Use a diluted bleach solution (e.g., dilute bleach with 1 part bleach and 10 parts water) or a household disinfectant with a label that says EPA-registered for coronaviruses. To make a bleach   solution at home, add 1 tablespoon of bleach to 1 quart (4 cups) of water. For a larger supply, add  cup of bleach to 1 gallon (16 cups) of water. Read labels of cleaning products and follow recommendations provided on product labels. Labels contain instructions for safe and effective use of the cleaning product including precautions you should take when applying the product, such as wearing gloves or eye protection and making sure you have good ventilation  during use of the product. Remove gloves and wash hands immediately after cleaning.  Monitor yourself for signs and symptoms of illness Caregivers and household members are considered close contacts, should monitor their health, and will be asked to limit movement outside of the home to the extent possible. Follow the monitoring steps for close contacts listed on the symptom monitoring form.   ? If you have additional questions, contact your local health department or call the epidemiologist on call at [phone] (available 24/7). ? This guidance is subject to change. For the most up-to-date guidance from [facility], please refer to their website: YouBlogs.pl

## 2018-08-13 NOTE — ED Notes (Signed)
Pt c/o chest congestion and cough x 2 days. Pt reports his cough is sometimes productive, but only a small amount. Pt reports his work in [place] will not let him return to work until he has been tested for the Covid-19 and the test shows negative.

## 2019-04-19 ENCOUNTER — Other Ambulatory Visit: Payer: Self-pay

## 2019-04-19 DIAGNOSIS — Z20822 Contact with and (suspected) exposure to covid-19: Secondary | ICD-10-CM

## 2019-04-20 LAB — NOVEL CORONAVIRUS, NAA: SARS-CoV-2, NAA: NOT DETECTED

## 2019-04-22 ENCOUNTER — Telehealth: Payer: Self-pay | Admitting: Family Medicine

## 2019-04-22 NOTE — Telephone Encounter (Signed)
Negative COVID results given. Patient results "NOT Detected." Caller expressed understanding. ° °

## 2019-12-08 IMAGING — DX DG SHOULDER 2+V*L*
2 series · 2 of 2 positions shown · non-contrast
Comparison: None.

CLINICAL DATA: Pain after accident

EXAM:
LEFT SHOULDER - 2+ VIEW

[shoulder grashey]
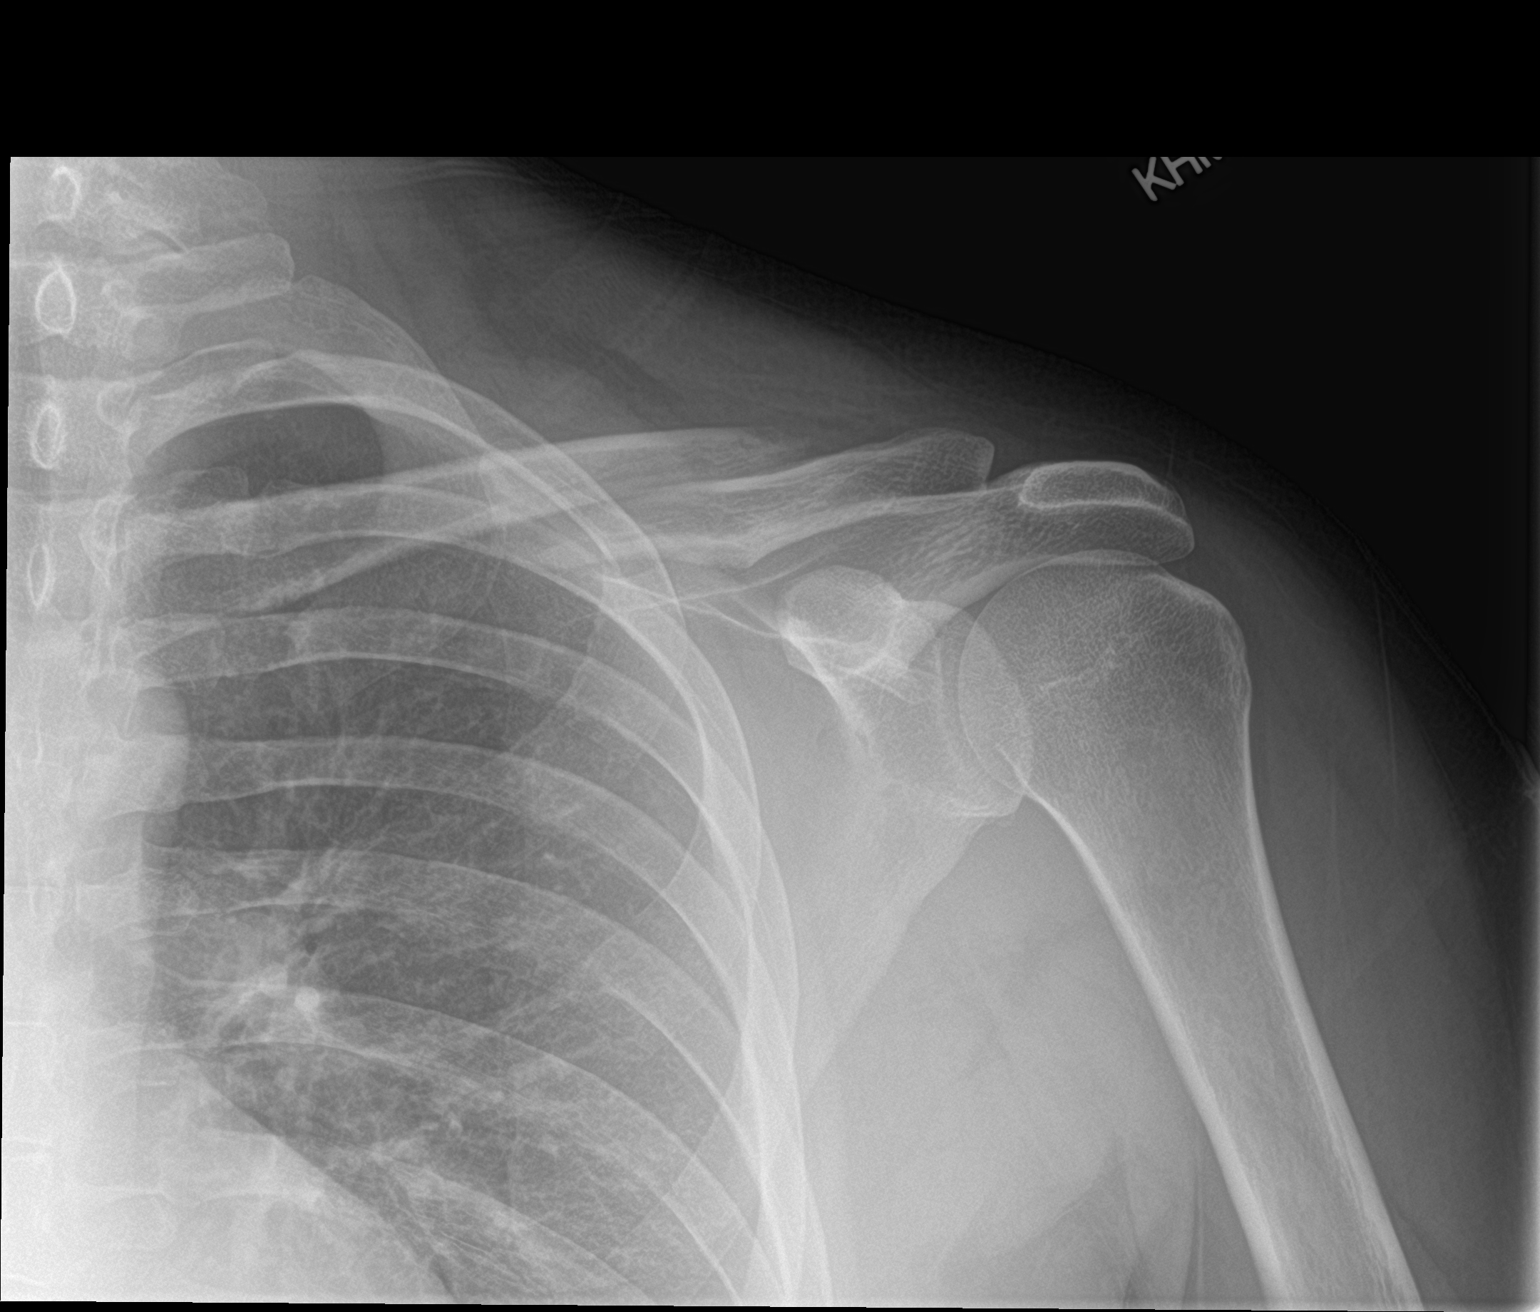

[shoulder y view]
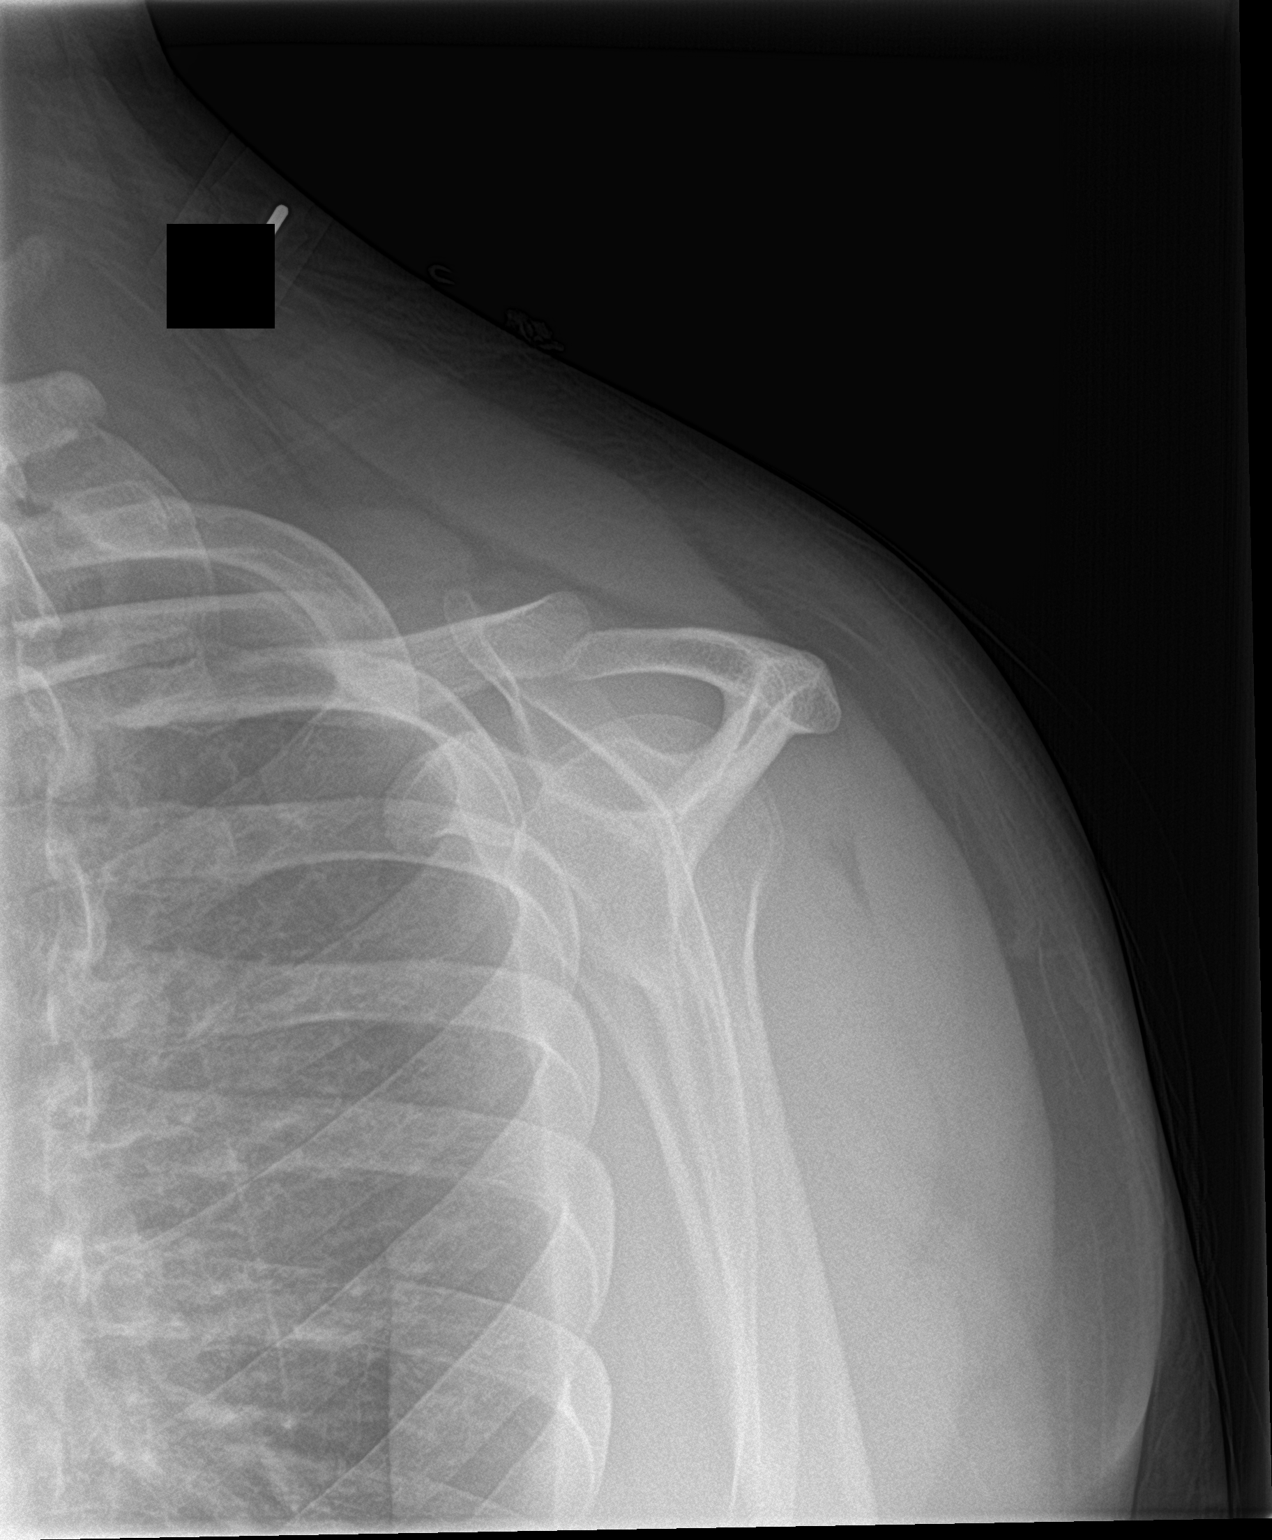

[2 of 2 positions shown; findings below may reference images not displayed]

FINDINGS: Frontal and Y scapular images were obtained. There is a fracture
through the junction mid and distal thirds of the clavicle with
inferior displacement of the lateral fracture fragment with respect
to the medial fragment. There is 4.7 cm of overriding of fracture
fragments. No other fractures. No dislocation. Joint spaces appear
normal. Visualized left lung clear.
IMPRESSION: Fracture junction mid and lateral thirds of clavicle with inferior
displacement of the lateral fracture fragment and overriding of
fracture fragments. No other fracture. No dislocation. No
appreciable arthropathy.

## 2022-05-25 ENCOUNTER — Encounter (HOSPITAL_COMMUNITY): Payer: Self-pay | Admitting: *Deleted

## 2022-05-25 ENCOUNTER — Other Ambulatory Visit: Payer: Self-pay

## 2022-05-25 ENCOUNTER — Emergency Department (HOSPITAL_COMMUNITY)
Admission: EM | Admit: 2022-05-25 | Discharge: 2022-05-25 | Disposition: A | Discharge status: Home / Self Care | Payer: BC Managed Care – PPO | Attending: Emergency Medicine | Admitting: Emergency Medicine

## 2022-05-25 ENCOUNTER — Emergency Department (HOSPITAL_COMMUNITY): Payer: BC Managed Care – PPO

## 2022-05-25 DIAGNOSIS — R0789 Other chest pain: Secondary | ICD-10-CM | POA: Diagnosis not present

## 2022-05-25 DIAGNOSIS — R42 Dizziness and giddiness: Secondary | ICD-10-CM | POA: Insufficient documentation

## 2022-05-25 DIAGNOSIS — R079 Chest pain, unspecified: Secondary | ICD-10-CM | POA: Diagnosis not present

## 2022-05-25 LAB — CBC
HCT: 46.6 % (ref 39.0–52.0)
Hemoglobin: 16 g/dL (ref 13.0–17.0)
MCH: 29.1 pg (ref 26.0–34.0)
MCHC: 34.3 g/dL (ref 30.0–36.0)
MCV: 84.9 fL (ref 80.0–100.0)
Platelets: 286 10*3/uL (ref 150–400)
RBC: 5.49 MIL/uL (ref 4.22–5.81)
RDW: 12.1 % (ref 11.5–15.5)
WBC: 9.5 10*3/uL (ref 4.0–10.5)
nRBC: 0 % (ref 0.0–0.2)

## 2022-05-25 LAB — BASIC METABOLIC PANEL
Anion gap: 10 (ref 5–15)
BUN: 13 mg/dL (ref 6–20)
CO2: 21 mmol/L — ABNORMAL LOW (ref 22–32)
Calcium: 9.4 mg/dL (ref 8.9–10.3)
Chloride: 102 mmol/L (ref 98–111)
Creatinine, Ser: 0.79 mg/dL (ref 0.61–1.24)
GFR, Estimated: >60 mL/min (ref >60)
Glucose, Bld: 105 mg/dL — ABNORMAL HIGH (ref 70–99)
Potassium: 3.5 mmol/L (ref 3.5–5.1)
Sodium: 133 mmol/L — ABNORMAL LOW (ref 135–145)

## 2022-05-25 LAB — TROPONIN I (HIGH SENSITIVITY)
Troponin I (High Sensitivity): 3 ng/L (ref <18)
Troponin I (High Sensitivity): 3 ng/L (ref <18)

## 2022-05-25 LAB — D-DIMER, QUANTITATIVE: D-Dimer, Quant: <0.27 ug/mL-FEU (ref 0.00–0.50)

## 2022-05-25 LAB — TSH: TSH: 1.235 u[IU]/mL (ref 0.350–4.500)

## 2022-05-25 MED ORDER — SODIUM CHLORIDE 0.9 % IV BOLUS
1000.0000 mL | Freq: Once | INTRAVENOUS | Status: AC
  Administered 2022-05-25: 1000 mL via INTRAVENOUS

## 2022-05-25 NOTE — ED Provider Notes (Signed)
[facility] EMERGENCY DEPARTMENT Provider Note   CSN: [id number] Arrival date & time: 05/25/22  1402     History  Chief Complaint  Patient presents with   Chest Pain    Derek [name] is a 32 y.o. male.   Chest Pain Associated symptoms: no abdominal pain, no back pain, no cough, no dizziness, no fever, no headache, no nausea, no numbness, no palpitations, no shortness of breath, no vomiting and no weakness         Derek [name] is a 32 y.o. male who presents to the Emergency Department complaining of sharp stabbing pains of his left and central chest that has been intermittent since the days ago.  He states that he sleeps on his abdomen.  He woke up Thursday morning with pain of the left chest that he describes as sharp stabbing type pains.  Spontaneously resolved.  Pain returned the following day again after sleeping on his abdomen, this time pain radiated across to his mid and right chest.  Again spontaneously resolved.  Notes last 2 nights that he has slept on his back and has not had further chest pain.  Today, he notes feeling somewhat lightheaded with position change and has "tender spots."  Of his upper chest.  He denies any shortness of breath, cough, known injury, back pain, and denies any cocaine use although admits to smoking marijuana. No hx of cardiac issues   Home Medications Prior to Admission medications   Medication Sig Start Date End Date Taking? Authorizing Provider  ibuprofen (ADVIL,MOTRIN) 600 MG tablet Take 1 tablet (600 mg total) by mouth every 6 (six) hours as needed. Patient not taking: Reported on 12/23/2017 10/10/17   Pauline Aus, PA-C      Allergies    Penicillins, Amoxicillin, and Latex    Review of Systems   Review of Systems  Constitutional:  Negative for chills and fever.  Respiratory:  Negative for cough, chest tightness and shortness of breath.   Cardiovascular:  Positive for chest pain. Negative for palpitations.  Gastrointestinal:   Negative for abdominal pain, nausea and vomiting.  Genitourinary:  Negative for flank pain.  Musculoskeletal:  Negative for back pain, neck pain and neck stiffness.  Skin:  Negative for rash.  Neurological:  Positive for light-headedness. Negative for dizziness, syncope, weakness, numbness and headaches.    Physical Exam Updated Vital Signs BP (!) 148/101   Pulse (!) 112   Temp 98.1 F (36.7 C) (Oral)   Resp (!) 23   Ht 6\' 1"  (1.854 m)   Wt 111.1 kg   SpO2 97%   BMI 32.32 kg/m  Physical Exam Vitals and nursing note reviewed.  Constitutional:      General: He is not in acute distress.    Appearance: He is well-developed. He is not ill-appearing or toxic-appearing.  HENT:     Mouth/Throat:     Mouth: Mucous membranes are moist.  Eyes:     Extraocular Movements: Extraocular movements intact.     Conjunctiva/sclera: Conjunctivae normal.     Pupils: Pupils are equal, round, and reactive to light.  Cardiovascular:     Rate and Rhythm: Normal rate and regular rhythm.     Pulses: Normal pulses.  Pulmonary:     Effort: Pulmonary effort is normal. No respiratory distress.  Chest:     Chest wall: Tenderness (slightly reproducable left sided chest pain with palpation.) present.  Abdominal:     Palpations: Abdomen is soft.     Tenderness:  There is no abdominal tenderness.  Musculoskeletal:     Cervical back: Normal range of motion.     Right lower leg: No edema.     Left lower leg: No edema.  Skin:    General: Skin is warm.     Capillary Refill: Capillary refill takes less than 2 seconds.     Findings: No rash.  Neurological:     General: No focal deficit present.     Mental Status: He is alert.     Sensory: No sensory deficit.     Motor: No weakness.     ED Results / Procedures / Treatments   Labs (all labs ordered are listed, but only abnormal results are displayed) Labs Reviewed  BASIC METABOLIC PANEL - Abnormal; Notable for the following components:      Result  Value   Sodium 133 (*)    CO2 21 (*)    Glucose, Bld 105 (*)    All other components within normal limits  CBC  D-DIMER, QUANTITATIVE  TSH  TROPONIN I (HIGH SENSITIVITY)  TROPONIN I (HIGH SENSITIVITY)    EKG EKG Interpretation  Date/Time:  Sunday May 25 2022 14:14:28 EST Ventricular Rate:  134 PR Interval:  138 QRS Duration: 74 QT Interval:  288 QTC Calculation: 430 R Axis:   137 Text Interpretation: Sinus tachycardia Right axis deviation Abnormal ECG No previous ECGs available Confirmed by Tanda Rockers ([id number]) on 05/26/2022 8:51:05 PM  Radiology DG Chest Port 1 View  Result Date: 05/25/2022 CLINICAL DATA:  Chest pain. EXAM: PORTABLE CHEST 1 VIEW COMPARISON:  None Available. FINDINGS: The heart size and mediastinal contours are within normal limits. Both lungs are clear. The visualized skeletal structures are unremarkable. IMPRESSION: No active disease. Electronically Signed   By: Gerome Sam III M.D.   On: 05/25/2022 15:39    Procedures Procedures    Medications Ordered in ED Medications - No data to display  ED Course/ Medical Decision Making/ A&P                           Medical Decision Making Patient here for intermittent episodes of sharp chest pain x 2 days.  No significant chest pain at present.  Pain began after sleeping on his abdomen.  Pain spontaneously resolved 2 days ago.  Not associated with neck jaw or arm pain.  No shortness of breath.  No history of cardiac issues or recent illness or cough.  On my exam, patient anxious appearing but nontoxic.  He is tachycardic and tachypneic as well as hypertensive.  Patient's mother is also in the exam room notes history of elevated blood pressure for several years but has not been evaluated recently and his never been diagnosed with hypertension. He admits to significant anxiety  I suspect symptoms are musculoskeletal, differential would also include ACS, pneumonia, PE, pericarditis also considered but felt less  likely.  Amount and/or Complexity of Data Reviewed Labs: ordered.    Details: Labs interpreted by me, no leukocytosis, chemistries without significant derangement.  D-dimer unremarkable, troponins remains flat at 3 Radiology: ordered.    Details: Chest x-ray without acute cardiopulmonary disease ECG/medicine tests: ordered.    Details: EKG shows sinus tachycardia. Discussion of management or test interpretation with external provider(s): On recheck, patient initially hypertensive, tachycardic.  He has reassuring D-dimer he was given IV fluids, 2 liters, and now seems more calm.  Vitals improved.  No longer tachycardic or hypertensive.  Low clinical  suspicion for ACS or PE.  Some of patient's symptoms may be anxiety driven.  I feel he is appropriate for discharge home, recommended close outpatient follow-up with PCP.  He is agreeable to plan.           Final Clinical Impression(s) / ED Diagnoses Final diagnoses:  Chest wall pain    Rx / DC Orders ED Discharge Orders     None         Pauline Aus, PA-C 05/27/22 1458    Bethann Berkshire, MD 05/28/22 [phone]

## 2022-05-25 NOTE — ED Triage Notes (Signed)
Pt with left CP intermittent since Thursday.  C/o "tender spots" to chest as well. Denies any SOB.  + lightheadedness with sudden position changes.    Pt with hx of anxiety per pt.

## 2022-05-25 NOTE — Discharge Instructions (Signed)
Your workup today was reassuring.  Please call one of the primary care providers listed to establish primary care.  Turn to the emergency department for any new or worsening symptoms.

## 2022-05-27 ENCOUNTER — Encounter: Payer: Self-pay | Admitting: Family Medicine

## 2022-05-27 ENCOUNTER — Ambulatory Visit (INDEPENDENT_AMBULATORY_CARE_PROVIDER_SITE_OTHER): Payer: BC Managed Care – PPO | Admitting: Family Medicine

## 2022-05-27 VITALS — BP 135/95 | HR 93 | Ht 73.0 in | Wt 250.0 lb

## 2022-05-27 DIAGNOSIS — F410 Panic disorder [episodic paroxysmal anxiety] without agoraphobia: Secondary | ICD-10-CM

## 2022-05-27 DIAGNOSIS — F419 Anxiety disorder, unspecified: Secondary | ICD-10-CM

## 2022-05-27 DIAGNOSIS — E785 Hyperlipidemia, unspecified: Secondary | ICD-10-CM | POA: Diagnosis not present

## 2022-05-27 DIAGNOSIS — R7301 Impaired fasting glucose: Secondary | ICD-10-CM

## 2022-05-27 DIAGNOSIS — Z23 Encounter for immunization: Secondary | ICD-10-CM

## 2022-05-27 DIAGNOSIS — I10 Essential (primary) hypertension: Secondary | ICD-10-CM

## 2022-05-27 DIAGNOSIS — Z114 Encounter for screening for human immunodeficiency virus [HIV]: Secondary | ICD-10-CM | POA: Diagnosis not present

## 2022-05-27 DIAGNOSIS — E559 Vitamin D deficiency, unspecified: Secondary | ICD-10-CM | POA: Diagnosis not present

## 2022-05-27 DIAGNOSIS — E039 Hypothyroidism, unspecified: Secondary | ICD-10-CM

## 2022-05-27 DIAGNOSIS — Z1159 Encounter for screening for other viral diseases: Secondary | ICD-10-CM

## 2022-05-27 MED ORDER — HYDROXYZINE HCL 10 MG PO TABS: 10.0000 mg | ORAL_TABLET | Freq: Two times a day (BID) | ORAL | 0 refills | Status: DC

## 2022-05-27 NOTE — Progress Notes (Addendum)
New Patient   Subjective:     Patient ID: Derek [name], male    DOB: 1991/02/27, 32 y.o.   MRN: [id number]  Chief Complaint  Patient presents with   Establish Care   Anxiety    Patient states he went to the ED for muscular issues, but thinks anxiety made his BP and HR increase.     Mr.Derek [name] 32 year old male with no past medical history, no hx cardiac issues presents to the clinic complaining of anxiety and feeling unease about his health.He has the following symptom include palpitations. Onset of symptoms was approximately a few days ago, gradually worsening since that time. He denies current suicidal and homicidal ideation.Family history significant for anxiety related to his mother. Recent ER visit 2 days ago EKG showed sinus tachycardia which was resolved when discharged, chest Xray was normal and chest pain was concluded as musculoskeletal and anxiety related.      Anxiety Presents for initial visit. Onset was in the past 7 days. The problem has been waxing and waning. Symptoms include nervous/anxious behavior and panic. Patient reports no nausea or shortness of breath. Symptoms occur occasionally. The severity of symptoms is mild. The patient sleeps 7 hours per night. The quality of sleep is good.   Risk factors include family history. Treatments tried: no medications tried for anxiety.     Review of Systems  Constitutional:  Negative for chills and fever.  HENT:  Negative for hearing loss, sinus pain and sore throat.   Eyes:  Negative for blurred vision.  Respiratory:  Negative for shortness of breath and wheezing.   Cardiovascular:        Chest tender, high blood pressure  Gastrointestinal:  Negative for nausea and vomiting.  Genitourinary:  Negative for dysuria.  Skin:  Negative for rash.  Neurological:  Negative for headaches.  Psychiatric/Behavioral:  The patient is nervous/anxious.         Objective:    BP (!) 135/95   Pulse 93   Ht 6\' 1"  (1.854  m)   Wt 250 lb (113.4 kg)   SpO2 97%   BMI 32.98 kg/m  BP Readings from Last 3 Encounters:  05/27/22 (!) 135/95  05/25/22 134/89  08/13/18 (!) 151/91      Physical Exam HENT:     Head: Normocephalic.     Nose: Nose normal.  Eyes:     Pupils: Pupils are equal, round, and reactive to light.  Cardiovascular:     Pulses: Normal pulses.  Pulmonary:     Effort: Pulmonary effort is normal.  Musculoskeletal:     Cervical back: Normal range of motion.  Skin:    General: Skin is warm and dry.  Neurological:     Mental Status: He is alert.  Psychiatric:        Mood and Affect: Mood normal.     Results for orders placed or performed in visit on 05/27/22  Microalbumin / creatinine urine ratio  Result Value Ref Range   Creatinine, Urine 356.1 Not Estab. mg/dL   Microalbumin, Urine 07/26/22 Not Estab. ug/mL   Microalb/Creat Ratio 6 0 - 29 mg/g creat  Hepatitis C antibody  Result Value Ref Range   Hep C Virus Ab Non Reactive Non Reactive  VITAMIN D 25 Hydroxy (Vit-D Deficiency, Fractures)  Result Value Ref Range   Vit D, 25-Hydroxy 7.6 (L) 30.0 - 100.0 ng/mL  HIV Antibody (routine testing w rflx)  Result Value Ref Range   HIV Screen  4th Generation wRfx Non Reactive Non Reactive  TSH + free T4  Result Value Ref Range   TSH 1.450 0.450 - 4.500 uIU/mL   Free T4 1.63 0.82 - 1.77 ng/dL  Lipid panel  Result Value Ref Range   Cholesterol, Total 173 100 - 199 mg/dL   Triglycerides 301 (H) 0 - 149 mg/dL   HDL 30 (L) >60 mg/dL   VLDL Cholesterol Cal 30 5 - 40 mg/dL   LDL Chol Calc (NIH) 109 (H) 0 - 99 mg/dL   Chol/HDL Ratio 5.8 (H) 0.0 - 5.0 ratio  Hemoglobin A1c  Result Value Ref Range   Hgb A1c MFr Bld 5.5 4.8 - 5.6 %   Est. average glucose Bld gHb Est-mCnc 111 mg/dL  NAT55+DDUK  Result Value Ref Range   Glucose 103 (H) 70 - 99 mg/dL   BUN 9 6 - 20 mg/dL   Creatinine, Ser 0.25 0.76 - 1.27 mg/dL   eGFR 427 >06 CB/JSE/8.31   BUN/Creatinine Ratio 10 9 - 20   Sodium 142 134 -  144 mmol/L   Potassium 4.1 3.5 - 5.2 mmol/L   Chloride 104 96 - 106 mmol/L   CO2 19 (L) 20 - 29 mmol/L   Calcium 10.1 8.7 - 10.2 mg/dL   Total Protein 7.6 6.0 - 8.5 g/dL   Albumin 5.0 4.3 - 5.2 g/dL   Globulin, Total 2.6 1.5 - 4.5 g/dL   Albumin/Globulin Ratio 1.9 1.2 - 2.2   Bilirubin Total 0.6 0.0 - 1.2 mg/dL   Alkaline Phosphatase 68 44 - 121 IU/L   AST 26 0 - 40 IU/L   ALT 29 0 - 44 IU/L  CBC with Differential/Platelet  Result Value Ref Range   WBC 6.1 3.4 - 10.8 x10E3/uL   RBC 5.59 4.14 - 5.80 x10E6/uL   Hemoglobin 15.9 13.0 - 17.7 g/dL   Hematocrit 51.7 61.6 - 51.0 %   MCV 84 79 - 97 fL   MCH 28.4 26.6 - 33.0 pg   MCHC 33.8 31.5 - 35.7 g/dL   RDW 07.3 71.0 - 62.6 %   Platelets 327 150 - 450 x10E3/uL   Neutrophils 54 Not Estab. %   Lymphs 34 Not Estab. %   Monocytes 9 Not Estab. %   Eos 2 Not Estab. %   Basos 1 Not Estab. %   Neutrophils Absolute 3.3 1.4 - 7.0 x10E3/uL   Lymphocytes Absolute 2.1 0.7 - 3.1 x10E3/uL   Monocytes Absolute 0.6 0.1 - 0.9 x10E3/uL   EOS (ABSOLUTE) 0.1 0.0 - 0.4 x10E3/uL   Basophils Absolute 0.0 0.0 - 0.2 x10E3/uL   Immature Granulocytes 0 Not Estab. %   Immature Grans (Abs) 0.0 0.0 - 0.1 x10E3/uL        Assessment & Plan:   Problem List Items Addressed This Visit       Cardiovascular and Mediastinum   Primary hypertension - Primary    Blood pressure slightly elevated, possible due to anxiety Elaborated the importance lifestyle modification and following a low sodium diet start an exercise routine 3 to 5 days a week for a minimum 30 minutes each day Follow up in 3 months with blood pressure log to evaluate blood pressure trends.      Relevant Orders   Microalbumin / creatinine urine ratio (Completed)   CMP14+EGFR (Completed)     Other   Anxiety    Recent visit to the Er for chest pain made him feel anxious about his health. Pateint stated when he starts  feeling anxious his blood pressure shoots up. Referral to behavioral  health Prescribed hydroxyzine 10 mg       Other Visit Diagnoses     Need for hepatitis C screening test       Relevant Orders   Hepatitis C antibody (Completed)   Vitamin D deficiency       Relevant Orders   VITAMIN D 25 Hydroxy (Vit-D Deficiency, Fractures) (Completed)   Encounter for screening for HIV       Relevant Orders   HIV Antibody (routine testing w rflx) (Completed)   Hypothyroidism, unspecified type       Relevant Orders   TSH + free T4 (Completed)   Hyperlipidemia, unspecified hyperlipidemia type       Relevant Orders   Lipid panel (Completed)   CBC with Differential/Platelet (Completed)   IFG (impaired fasting glucose)       Relevant Orders   Hemoglobin A1c (Completed)   Need for Tdap vaccination       Relevant Orders   Tdap vaccine greater than or equal to 7yo IM (Completed)   Panic attacks       Relevant Orders   Ambulatory referral to Psychiatry       Meds ordered this encounter  Medications   DISCONTD: hydrOXYzine (ATARAX) 10 MG tablet    Sig: Take 1 tablet (10 mg total) by mouth 2 (two) times daily.    Dispense:  21 tablet    Refill:  0    No follow-ups on file.  Renard Hamper Ria Comment, FNP

## 2022-05-27 NOTE — Patient Instructions (Addendum)
Please Follow up with Psychiatry  Follow up in 3 months with blood pressure log Please take medications as ordered

## 2022-05-28 DIAGNOSIS — I10 Essential (primary) hypertension: Secondary | ICD-10-CM | POA: Insufficient documentation

## 2022-05-28 DIAGNOSIS — F419 Anxiety disorder, unspecified: Secondary | ICD-10-CM | POA: Insufficient documentation

## 2022-05-28 NOTE — Assessment & Plan Note (Addendum)
Blood pressure slightly elevated, possible due to anxiety Elaborated the importance lifestyle modification and following a low sodium diet start an exercise routine 3 to 5 days a week for a minimum 30 minutes each day Follow up in 3 months with blood pressure log to evaluate blood pressure trends.

## 2022-05-28 NOTE — Assessment & Plan Note (Addendum)
Recent visit to the Er for chest pain made him feel anxious about his health. Pateint stated when he starts feeling anxious his blood pressure shoots up. Referral to behavioral health Prescribed hydroxyzine 10 mg

## 2022-05-29 LAB — VITAMIN D 25 HYDROXY (VIT D DEFICIENCY, FRACTURES): Vit D, 25-Hydroxy: 7.6 ng/mL — ABNORMAL LOW (ref 30.0–100.0)

## 2022-05-29 LAB — CBC WITH DIFFERENTIAL/PLATELET
Basophils Absolute: 0 10*3/uL (ref 0.0–0.2)
Basos: 1 %
EOS (ABSOLUTE): 0.1 10*3/uL (ref 0.0–0.4)
Eos: 2 %
Hematocrit: 47 % (ref 37.5–51.0)
Hemoglobin: 15.9 g/dL (ref 13.0–17.7)
Immature Grans (Abs): 0 10*3/uL (ref 0.0–0.1)
Immature Granulocytes: 0 %
Lymphocytes Absolute: 2.1 10*3/uL (ref 0.7–3.1)
Lymphs: 34 %
MCH: 28.4 pg (ref 26.6–33.0)
MCHC: 33.8 g/dL (ref 31.5–35.7)
MCV: 84 fL (ref 79–97)
Monocytes Absolute: 0.6 10*3/uL (ref 0.1–0.9)
Monocytes: 9 %
Neutrophils Absolute: 3.3 10*3/uL (ref 1.4–7.0)
Neutrophils: 54 %
Platelets: 327 10*3/uL (ref 150–450)
RBC: 5.59 x10E6/uL (ref 4.14–5.80)
RDW: 11.9 % (ref 11.6–15.4)
WBC: 6.1 10*3/uL (ref 3.4–10.8)

## 2022-05-29 LAB — CMP14+EGFR
ALT: 29 IU/L (ref 0–44)
AST: 26 IU/L (ref 0–40)
Albumin/Globulin Ratio: 1.9 (ref 1.2–2.2)
Albumin: 5 g/dL (ref 4.3–5.2)
Alkaline Phosphatase: 68 IU/L (ref 44–121)
BUN/Creatinine Ratio: 10 (ref 9–20)
BUN: 9 mg/dL (ref 6–20)
Bilirubin Total: 0.6 mg/dL (ref 0.0–1.2)
CO2: 19 mmol/L — ABNORMAL LOW (ref 20–29)
Calcium: 10.1 mg/dL (ref 8.7–10.2)
Chloride: 104 mmol/L (ref 96–106)
Creatinine, Ser: 0.89 mg/dL (ref 0.76–1.27)
Globulin, Total: 2.6 g/dL (ref 1.5–4.5)
Glucose: 103 mg/dL — ABNORMAL HIGH (ref 70–99)
Potassium: 4.1 mmol/L (ref 3.5–5.2)
Sodium: 142 mmol/L (ref 134–144)
Total Protein: 7.6 g/dL (ref 6.0–8.5)
eGFR: 118 mL/min/{1.73_m2} (ref >59)

## 2022-05-29 LAB — LIPID PANEL
Chol/HDL Ratio: 5.8 ratio — ABNORMAL HIGH (ref 0.0–5.0)
Cholesterol, Total: 173 mg/dL (ref 100–199)
HDL: 30 mg/dL — ABNORMAL LOW (ref >39)
LDL Chol Calc (NIH): 113 mg/dL — ABNORMAL HIGH (ref 0–99)
Triglycerides: 169 mg/dL — ABNORMAL HIGH (ref 0–149)
VLDL Cholesterol Cal: 30 mg/dL (ref 5–40)

## 2022-05-29 LAB — HEMOGLOBIN A1C
Est. average glucose Bld gHb Est-mCnc: 111 mg/dL
Hgb A1c MFr Bld: 5.5 % (ref 4.8–5.6)

## 2022-05-29 LAB — MICROALBUMIN / CREATININE URINE RATIO
Creatinine, Urine: 356.1 mg/dL
Microalb/Creat Ratio: 6 mg/g creat (ref 0–29)
Microalbumin, Urine: 22.8 ug/mL

## 2022-05-29 LAB — HIV ANTIBODY (ROUTINE TESTING W REFLEX): HIV Screen 4th Generation wRfx: NONREACTIVE

## 2022-05-29 LAB — TSH+FREE T4
Free T4: 1.63 ng/dL (ref 0.82–1.77)
TSH: 1.45 u[IU]/mL (ref 0.450–4.500)

## 2022-05-29 LAB — HEPATITIS C ANTIBODY: Hep C Virus Ab: NONREACTIVE

## 2022-06-05 ENCOUNTER — Other Ambulatory Visit: Payer: Self-pay

## 2022-06-05 ENCOUNTER — Telehealth: Payer: Self-pay | Admitting: Family Medicine

## 2022-06-05 DIAGNOSIS — F419 Anxiety disorder, unspecified: Secondary | ICD-10-CM

## 2022-06-05 MED ORDER — HYDROXYZINE HCL 10 MG PO TABS: 10.0000 mg | ORAL_TABLET | Freq: Two times a day (BID) | ORAL | 0 refills | Status: AC

## 2022-06-05 NOTE — Telephone Encounter (Signed)
Prescription Request  06/05/2022  Is this a "Controlled Substance" medicine? No  LOV: 05/27/2022  What is the name of the medication or equipment? hydrOXYzine (ATARAX) 10 MG tablet   Have you contacted your pharmacy to request a refill? No   Which pharmacy would you like this sent to?  [id number] - [place], [address] [place] [address] Phone: [phone] Fax: [phone]    Patient notified that their request is being sent to the clinical staff for review and that they should receive a response within 2 business days.   Please advise at [facility] CAN'T GET INTO PSYCHIATRY TILL 06/10/22. CAN THIS BE REFILLED TO LAST TILL HIS APPT?

## 2022-06-09 ENCOUNTER — Encounter: Payer: Self-pay | Admitting: Family Medicine

## 2022-06-09 ENCOUNTER — Ambulatory Visit (INDEPENDENT_AMBULATORY_CARE_PROVIDER_SITE_OTHER): Payer: BC Managed Care – PPO | Admitting: Family Medicine

## 2022-06-09 VITALS — BP 138/86 | HR 110 | Ht 73.0 in | Wt 247.1 lb

## 2022-06-09 DIAGNOSIS — I10 Essential (primary) hypertension: Secondary | ICD-10-CM

## 2022-06-09 DIAGNOSIS — R0789 Other chest pain: Secondary | ICD-10-CM | POA: Diagnosis not present

## 2022-06-09 DIAGNOSIS — F419 Anxiety disorder, unspecified: Secondary | ICD-10-CM

## 2022-06-09 DIAGNOSIS — L02214 Cutaneous abscess of groin: Secondary | ICD-10-CM

## 2022-06-09 MED ORDER — MUPIROCIN 2 % EX OINT: 1.0000 | TOPICAL_OINTMENT | Freq: Two times a day (BID) | CUTANEOUS | 0 refills | Status: AC

## 2022-06-09 MED ORDER — CYCLOBENZAPRINE HCL 5 MG PO TABS: 5.0000 mg | ORAL_TABLET | Freq: Three times a day (TID) | ORAL | 1 refills | Status: AC | PRN

## 2022-06-09 NOTE — Progress Notes (Signed)
Acute Office Visit  Subjective:    Patient ID: Derek [name], male    DOB: March 18, 1991, 32 y.o.   MRN: [id number]  Chief Complaint  Patient presents with   Hypertension    Pt reports high heart rate and elevated bp, has bp log. Reports slight pressure in chest     HPI Patient is in today with reports of elevated BP, palpitations, and chest wall pain.  Patient was seen in the ED on 05/25/2022 for similar symptoms with no findings of cardiac etiology.  Patient's symptoms are due to his anxiety.  He establish care with [place], [place], and was started on hydroxyzine 10 mg daily and referred to psychiatry.  He reports increased worrying and anxiousness,which elevated his heart rate and blood pressure.  He denies shortness of breath and chest tightness.  Abscess of the left groin: He reports having an abscess of the left groin a few weeks ago that has subsided.  Symptom wax and wane.  He complains of swelling, tenderness, and pain with flare [place].  None reported today.  He would like the sites examined.  History reviewed. No pertinent past medical history.  Past Surgical History:  Procedure Laterality Date   PILONIDAL CYST EXCISION N/A 05/23/2016   Procedure: CYST EXCISION PILONIDAL EXTENSIVE;  Surgeon: Vickie Epley, MD;  Location: [facility];  Service: General;  Laterality: N/A;    Family History  Problem Relation Age of Onset   Hypertension Mother    Cancer Father     Social History   Socioeconomic History   Marital status: Single    Spouse name: Not on file   Number of children: Not on file   Years of education: Not on file   Highest education level: Not on file  Occupational History   Not on file  Tobacco Use   Smoking status: Former    Packs/day: 0.50    Years: 10.00    Total pack years: 5.00    Types: Cigarettes    Quit date: 01/20/2016    Years since quitting: 6.3   Smokeless tobacco: Former    Quit date: 05/21/2013  Substance and Sexual Activity   Alcohol use: No   Drug  use: Yes    Types: Marijuana   Sexual activity: Yes    Birth control/protection: None  Other Topics Concern   Not on file  Social History Narrative   Not on file   Social Determinants of Health   Financial Resource Strain: Not on file  Food Insecurity: Not on file  Transportation Needs: Not on file  Physical Activity: Not on file  Stress: Not on file  Social Connections: Not on file  Intimate Partner Violence: Not on file    Outpatient Medications Prior to Visit  Medication Sig Dispense Refill   cholecalciferol (VITAMIN D3) 25 MCG (1000 UNIT) tablet Take 1,000 Units by mouth daily.     hydrOXYzine (ATARAX) 10 MG tablet Take 1 tablet (10 mg total) by mouth 2 (two) times daily. 20 tablet 0   No facility-administered medications prior to visit.    Allergies  Allergen Reactions   Amoxil [Amoxicillin] Anaphylaxis and Hives   Penicillins Anaphylaxis and Hives    Has patient had a PCN reaction causing immediate rash, facial/tongue/throat swelling, SOB or lightheadedness with hypotension: Yes Has patient had a PCN reaction causing severe rash involving mucus membranes or skin necrosis: No Has patient had a PCN reaction that required hospitalization Yes Has patient had a PCN reaction occurring  within the last 10 years: No If all of the above answers are "NO", then may proceed with Cephalosporin use.     Latex Rash    Review of Systems  Constitutional:  Negative for chills and fever.  Respiratory:  Negative for chest tightness and shortness of breath.   Cardiovascular:  Positive for palpitations.       Objective:    Physical Exam HENT:     Head: Normocephalic.     Right Ear: External ear normal.     Left Ear: External ear normal.     Nose: No congestion or rhinorrhea.     Mouth/Throat:     Mouth: Mucous membranes are moist.  Cardiovascular:     Rate and Rhythm: Regular rhythm. Tachycardia present.     Heart sounds: No murmur heard. Pulmonary:     Effort: No  respiratory distress.     Breath sounds: Normal breath sounds.  Skin:    Findings: Lesion: Brown discoloration noted at the crease of the left groin with no evidence of swelling, tenderness, and drainage.  Neurological:     Mental Status: He is alert.     BP 138/86 (BP Location: Left Arm)   Pulse (!) 110   Ht 6\' 1"  (1.854 m)   Wt 247 lb 1.9 oz (112.1 kg)   SpO2 98%   BMI 32.60 kg/m  Wt Readings from Last 3 Encounters:  06/09/22 247 lb 1.9 oz (112.1 kg)  05/27/22 250 lb (113.4 kg)  05/25/22 245 lb (111.1 kg)       Assessment & Plan:  Anxiety Assessment & Plan: The patient is following up with [facility] on 06/10/2022 Encouraged to continue treatment regimen and to implement relaxation techniques when anxious Will treat chest wall pain with Flexeril 5 mg Encouraged to take medication at bedtime due to side effects of drowsiness    Musculoskeletal chest pain -     Cyclobenzaprine HCl; Take 1 tablet (5 mg total) by mouth 3 (three) times daily as needed for muscle spasms.  Dispense: 30 tablet; Refill: 1  Abscess of groin, left Assessment & Plan: No evidence of abscess was noted on physical examination today We will provide topical antibiotic ointment for prophylactic treatment of bacterial infection Encouraged to inform us when having a flareup to start the patient on oral antibiotic  Orders: -     Mupirocin; Apply 1 Application topically 2 (two) times daily.  Dispense: 22 g; Refill: 0  Primary hypertension Assessment & Plan: Controlled in the clinic Ambulatory readings show systolic BP in the 409W and diastolic in the 11B Encouraged to implement low-sodium diet with increased physical activities Will follow-up with PCP in 2 weeks for BP evaluation Will recommend starting patient on antihypertensive if diastolic remains in the 14N BP Readings from Last 3 Encounters:  06/09/22 138/86  05/27/22 (!) 135/95  05/25/22 134/89        Alvira Monday,  FNP

## 2022-06-09 NOTE — Patient Instructions (Addendum)
I appreciate the opportunity to provide care to you today!    Follow up:  2 weeks for BP with PCP  Please continue monitoring your blood pressure daily and bring the readings with you at your next appointment I recommend low-sodium diet with increased physical activities I recommend decreasing your daily sodium intake to less than 1500mg  daily I recommend taking Flexeril at bedtime due to side effects of drowsiness    Please continue to a heart-healthy diet and increase your physical activities. Try to exercise for 73mins at least five times a week.   Physical activity helps: Lower your blood glucose, improve your heart health, lower your blood pressure and cholesterol, burn calories to help manage her weight, gave you energy, lower stress, and improve his sleep.  The American diabetes Association (ADA) recommends being active for 2-1/2 hours (150 minutes) or more week.  Exercise for 30 minutes, 5 days a week (150 minutes total)      It was a pleasure to see you and I look forward to continuing to work together on your health and well-being. Please do not hesitate to call the office if you need care or have questions about your care.   Have a wonderful day and week. With Gratitude, Alvira Monday MSN, FNP-BC

## 2022-06-10 DIAGNOSIS — L02214 Cutaneous abscess of groin: Secondary | ICD-10-CM | POA: Insufficient documentation

## 2022-06-10 DIAGNOSIS — F411 Generalized anxiety disorder: Secondary | ICD-10-CM | POA: Diagnosis not present

## 2022-06-10 NOTE — Assessment & Plan Note (Addendum)
The patient is following up with [facility] on 06/10/2022 Encouraged to continue treatment regimen and to implement relaxation techniques when anxious Will treat chest wall pain with Flexeril 5 mg Encouraged to take medication at bedtime due to side effects of drowsiness

## 2022-06-10 NOTE — Assessment & Plan Note (Signed)
No evidence of abscess was noted on physical examination today We will provide topical antibiotic ointment for prophylactic treatment of bacterial infection Encouraged to inform us when having a flareup to start the patient on oral antibiotic

## 2022-06-10 NOTE — Assessment & Plan Note (Signed)
Controlled in the clinic Ambulatory readings show systolic BP in the 165B and diastolic in the 90X Encouraged to implement low-sodium diet with increased physical activities Will follow-up with PCP in 2 weeks for BP evaluation Will recommend starting patient on antihypertensive if diastolic remains in the 83F BP Readings from Last 3 Encounters:  06/09/22 138/86  05/27/22 (!) 135/95  05/25/22 134/89

## 2022-06-23 ENCOUNTER — Encounter: Payer: Self-pay | Admitting: Family Medicine

## 2022-06-23 ENCOUNTER — Ambulatory Visit (INDEPENDENT_AMBULATORY_CARE_PROVIDER_SITE_OTHER): Payer: BC Managed Care – PPO | Admitting: Family Medicine

## 2022-06-23 VITALS — BP 128/72 | HR 89 | Ht 73.0 in | Wt 243.0 lb

## 2022-06-23 DIAGNOSIS — I10 Essential (primary) hypertension: Secondary | ICD-10-CM

## 2022-06-23 NOTE — Progress Notes (Unsigned)
   Patient Office Visit   Subjective   Patient ID: Derek [name], male    DOB: 1990/09/17  Age: 32 y.o. MRN: [id number]  CC:  Chief Complaint  Patient presents with   Anxiety    Patient is here for f/u. Sees psych, feels a little better.     HPI Derek [name] presents to clinic for follow up of elevated blood pressure readings as per last visit. He  has no past medical history on file.  Patient here for follow-up of elevated blood pressure from previous visits. He is exercising and is adherent to low salt diet.  Blood pressure is well controlled at home. Patient denies chest pressure/discomfort, fatigue, lower extremity edema, orthopnea, palpitations, and syncope. Cardiovascular risk factors: dyslipidemia, family history of premature cardiovascular disease, male gender, obesity (BMI >= 30 kg/m2), and smoking/ tobacco exposure    Outpatient Encounter Medications as of 06/23/2022  Medication Sig   cholecalciferol (VITAMIN D3) 25 MCG (1000 UNIT) tablet Take 1,000 Units by mouth daily.   cyclobenzaprine (FLEXERIL) 5 MG tablet Take 1 tablet (5 mg total) by mouth 3 (three) times daily as needed for muscle spasms.   escitalopram (LEXAPRO) 10 MG tablet Take 10 mg by mouth daily.   hydrOXYzine (ATARAX) 10 MG tablet Take 1 tablet (10 mg total) by mouth 2 (two) times daily.   mupirocin ointment (BACTROBAN) 2 % Apply 1 Application topically 2 (two) times daily.   No facility-administered encounter medications on file as of 06/23/2022.    Past Surgical History:  Procedure Laterality Date   PILONIDAL CYST EXCISION N/A 05/23/2016   Procedure: CYST EXCISION PILONIDAL EXTENSIVE;  Surgeon: Vickie Epley, MD;  Location: [facility];  Service: General;  Laterality: N/A;    Review of Systems  Constitutional:  Negative for chills and fever.  Respiratory:  Negative for shortness of breath.   Skin:  Negative for rash.  Psychiatric/Behavioral:  The patient is nervous/anxious.       Objective    BP  128/72   Pulse 89   Ht 6\' 1"  (1.854 m)   Wt 243 lb (110.2 kg)   SpO2 98%   BMI 32.06 kg/m   Physical Exam Vitals reviewed.  Cardiovascular:     Rate and Rhythm: Normal rate and regular rhythm.     Pulses: Normal pulses.  Pulmonary:     Effort: Pulmonary effort is normal. No respiratory distress.     Breath sounds: Normal breath sounds.  Skin:    General: Skin is warm and dry.     Capillary Refill: Capillary refill takes less than 2 seconds.  Neurological:     General: No focal deficit present.     Motor: No weakness.     Coordination: Coordination normal.  Psychiatric:        Mood and Affect: Mood normal.       Assessment & Plan:  Primary hypertension Assessment & Plan: Blood pressure well controlled. Blood pressure reading log from home: 123/89, 129/86, 124/79, 129/87, 136/83 Explained to follow a DASH diet which includes vegetables,fruits,whole grains, fat free or low fat diary,fish,poultry,beans,nuts and seeds,vegetable oils. Find an activity that you will enjoy and start to be active at least 5 days a week for 30 minutes each day.      Return in about 4 months (around 10/22/2022) for  follow-up cholestrol levels check.   Renard Hamper Ria Comment, FNP

## 2022-06-23 NOTE — Patient Instructions (Addendum)
It was a pleasure meeting with you today. Please take your medications as prescribed Please follow up in 4 months for cholesterol levels check

## 2022-06-24 DIAGNOSIS — F411 Generalized anxiety disorder: Secondary | ICD-10-CM | POA: Diagnosis not present

## 2022-06-25 ENCOUNTER — Encounter: Payer: Self-pay | Admitting: Family Medicine

## 2022-06-25 NOTE — Assessment & Plan Note (Signed)
Blood pressure well controlled. Blood pressure reading log from home: 123/89, 129/86, 124/79, 129/87, 136/83 Explained to follow a DASH diet which includes vegetables,fruits,whole grains, fat free or low fat diary,fish,poultry,beans,nuts and seeds,vegetable oils. Find an activity that you will enjoy and start to be active at least 5 days a week for 30 minutes each day.

## 2022-08-06 DIAGNOSIS — F411 Generalized anxiety disorder: Secondary | ICD-10-CM | POA: Diagnosis not present

## 2022-08-27 ENCOUNTER — Encounter: Payer: Self-pay | Admitting: Family Medicine

## 2022-08-27 ENCOUNTER — Ambulatory Visit (INDEPENDENT_AMBULATORY_CARE_PROVIDER_SITE_OTHER): Payer: BC Managed Care – PPO | Admitting: Family Medicine

## 2022-08-27 VITALS — BP 134/84 | HR 100 | Ht 73.0 in | Wt 257.0 lb

## 2022-08-27 DIAGNOSIS — F419 Anxiety disorder, unspecified: Secondary | ICD-10-CM

## 2022-08-27 NOTE — Assessment & Plan Note (Signed)
    08/27/2022    8:55 AM 06/23/2022    2:49 PM 06/09/2022    2:48 PM 05/27/2022    8:18 AM  GAD 7 : Generalized Anxiety Score  Nervous, Anxious, on Edge 1 1 2 2   Control/stop worrying 1 1 2 3   Worry too much - different things 0 1 2 3   Trouble relaxing 0 0 1 3  Restless 1 0 0 0  Easily annoyed or irritable 0 1 1 1   Afraid - awful might happen 1 1 2 3   Total GAD 7 Score 4 5 10 15   Anxiety Difficulty Not difficult at all Somewhat difficult Not difficult at all Somewhat difficult   Patient reported talking  Lexapro 10 mg daily and tolerating medication well. Discussed about cognitive behavioral therapy focusing on thoughts, belief, and attitudes that affects feelings and behavior, learning about coping skills to deal with certain problems. Maintaining a consistent routine and schedule, Practice stress management and self calming techniques, excersise regularly and spend time outdoors, Do not eat food that are high in fat, added sugar, or salt.

## 2022-08-27 NOTE — Progress Notes (Signed)
Patient Office Visit   Subjective   Patient ID: Derek [name], male    DOB: [birth year]  Age: 32 y.o. MRN: [id number]  CC:  Chief Complaint  Patient presents with   Anxiety    Patient is here for f/u of anxiety. Patient is seeing behavioral health and feels that it is helping.     HPI Derek [name] 32 year old male, presents to the clinic for follow up for anxiety. He  has no past medical history on file.For the details of today's visit, please refer to assessment and plan.   HPI   . Outpatient Encounter Medications as of 08/27/2022  Medication Sig   cholecalciferol (VITAMIN D3) 25 MCG (1000 UNIT) tablet Take 1,000 Units by mouth daily.   cyclobenzaprine (FLEXERIL) 5 MG tablet Take 1 tablet (5 mg total) by mouth 3 (three) times daily as needed for muscle spasms.   escitalopram (LEXAPRO) 10 MG tablet Take 10 mg by mouth daily.   hydrOXYzine (ATARAX) 10 MG tablet Take 1 tablet (10 mg total) by mouth 2 (two) times daily.   mupirocin ointment (BACTROBAN) 2 % Apply 1 Application topically 2 (two) times daily.   No facility-administered encounter medications on file as of 08/27/2022.    Past Surgical History:  Procedure Laterality Date   PILONIDAL CYST EXCISION N/A 05/23/2016   Procedure: CYST EXCISION PILONIDAL EXTENSIVE;  Surgeon: Ancil Linsey, MD;  Location: [facility];  Service: General;  Laterality: N/A;    Review of Systems  Constitutional:  Negative for chills and fever.  HENT:  Negative for ear pain.   Eyes:  Negative for blurred vision.  Respiratory:  Negative for shortness of breath.   Cardiovascular:  Negative for chest pain.  Gastrointestinal:  Negative for abdominal pain.  Genitourinary:  Negative for dysuria.  Musculoskeletal:  Negative for myalgias.  Skin:  Negative for rash.  Neurological:  Negative for dizziness and headaches.  Psychiatric/Behavioral:  The patient is not nervous/anxious.       Objective    BP 134/84   Pulse 100   Ht 6\' 1"  (1.854 m)    Wt 257 lb (116.6 kg)   SpO2 97%   BMI 33.91 kg/m   Physical Exam Vitals reviewed.  Constitutional:      Appearance: Normal appearance.  HENT:     Head: Normocephalic.     Nose: Nose normal.  Eyes:     Conjunctiva/sclera: Conjunctivae normal.     Pupils: Pupils are equal, round, and reactive to light.  Cardiovascular:     Rate and Rhythm: Normal rate and regular rhythm.     Pulses: Normal pulses.     Heart sounds: Normal heart sounds.  Pulmonary:     Effort: Pulmonary effort is normal.     Breath sounds: Normal breath sounds.  Abdominal:     General: Bowel sounds are normal.     Palpations: Abdomen is soft. There is no mass.     Tenderness: There is no abdominal tenderness. There is no right CVA tenderness or left CVA tenderness.  Musculoskeletal:        General: Normal range of motion.     Right lower leg: No edema.     Left lower leg: No edema.  Skin:    General: Skin is warm and dry.     Capillary Refill: Capillary refill takes less than 2 seconds.  Neurological:     General: No focal deficit present.     Mental Status: He is  alert.     Coordination: Coordination normal.     Gait: Gait normal.  Psychiatric:        Mood and Affect: Mood normal.       Assessment & Plan:  Anxiety Assessment & Plan:    08/27/2022    8:55 AM 06/23/2022    2:49 PM 06/09/2022    2:48 PM 05/27/2022    8:18 AM  GAD 7 : Generalized Anxiety Score  Nervous, Anxious, on Edge 1 1 2 2   Control/stop worrying 1 1 2 3   Worry too much - different things 0 1 2 3   Trouble relaxing 0 0 1 3  Restless 1 0 0 0  Easily annoyed or irritable 0 1 1 1   Afraid - awful might happen 1 1 2 3   Total GAD 7 Score 4 5 10 15   Anxiety Difficulty Not difficult at all Somewhat difficult Not difficult at all Somewhat difficult   Patient reported talking  Lexapro 10 mg daily and tolerating medication well. Discussed about cognitive behavioral therapy focusing on thoughts, belief, and attitudes that affects feelings  and behavior, learning about coping skills to deal with certain problems. Maintaining a consistent routine and schedule, Practice stress management and self calming techniques, excersise regularly and spend time outdoors, Do not eat food that are high in fat, added sugar, or salt.       No follow-ups on file.   Cruzita Lederer Newman Nip, FNP

## 2022-08-27 NOTE — Patient Instructions (Signed)
It was pleasure meeting with you today. Please take medications as prescribed. Follow up with your primary health provider if any health concerns arises. If symptoms worsen please contact your primary care provider and/or visit the emergency department.  

## 2022-08-27 NOTE — Addendum Note (Signed)
Addended by: DEL ORBE POLANCO, Dajour Pierpoint on: 08/27/2022 01:00 PM   Modules accepted: Level of Service  

## 2022-10-22 DIAGNOSIS — F411 Generalized anxiety disorder: Secondary | ICD-10-CM | POA: Diagnosis not present

## 2022-10-27 ENCOUNTER — Encounter: Payer: Self-pay | Admitting: Family Medicine

## 2022-10-27 ENCOUNTER — Ambulatory Visit (INDEPENDENT_AMBULATORY_CARE_PROVIDER_SITE_OTHER): Payer: BC Managed Care – PPO | Admitting: Family Medicine

## 2022-10-27 VITALS — BP 130/76 | HR 119 | Ht 73.0 in | Wt 256.0 lb

## 2022-10-27 DIAGNOSIS — E782 Mixed hyperlipidemia: Secondary | ICD-10-CM | POA: Diagnosis not present

## 2022-10-27 DIAGNOSIS — H6123 Impacted cerumen, bilateral: Secondary | ICD-10-CM | POA: Diagnosis not present

## 2022-10-27 DIAGNOSIS — E559 Vitamin D deficiency, unspecified: Secondary | ICD-10-CM | POA: Diagnosis not present

## 2022-10-27 DIAGNOSIS — E785 Hyperlipidemia, unspecified: Secondary | ICD-10-CM | POA: Insufficient documentation

## 2022-10-27 DIAGNOSIS — H6120 Impacted cerumen, unspecified ear: Secondary | ICD-10-CM | POA: Insufficient documentation

## 2022-10-27 MED ORDER — CARBAMIDE PEROXIDE 6.5 % OT SOLN: 3.0000 [drp] | Freq: Two times a day (BID) | OTIC | 0 refills | Status: AC

## 2022-10-27 NOTE — Assessment & Plan Note (Signed)
LDL Goal <70 Current LDL 113 Labs ordered today. Discussed lifestyle modifications follow diet low in saturated fat, reduce dietary salt intake, avoid fatty foods, maintain an exercise routine 3 to 5 days a week for a minimum total of 150 minutes.

## 2022-10-27 NOTE — Progress Notes (Signed)
Patient Office Visit   Subjective   Patient ID: Derek [name], male    DOB: 10/27/1990  Age: 32 y.o. MRN: [id number]  CC:  Chief Complaint  Patient presents with   Hyperlipidemia    Patient is here for lipid f/u. No changes or concerns since last visit.     HPI Derek [name] 32 year old male, presents to the clinic for follow up.  He  has a past medical history of Anxiety and Hyperlipidemia.  HPI    Outpatient Encounter Medications as of 10/27/2022  Medication Sig   carbamide peroxide (DEBROX) 6.5 % OTIC solution Place 3 drops into both ears 2 (two) times daily. Up to 4 days   cholecalciferol (VITAMIN D3) 25 MCG (1000 UNIT) tablet Take 1,000 Units by mouth daily.   cyclobenzaprine (FLEXERIL) 5 MG tablet Take 1 tablet (5 mg total) by mouth 3 (three) times daily as needed for muscle spasms.   escitalopram (LEXAPRO) 10 MG tablet Take 10 mg by mouth daily.   hydrOXYzine (ATARAX) 10 MG tablet Take 1 tablet (10 mg total) by mouth 2 (two) times daily.   mupirocin ointment (BACTROBAN) 2 % Apply 1 Application topically 2 (two) times daily.   No facility-administered encounter medications on file as of 10/27/2022.    Past Surgical History:  Procedure Laterality Date   PILONIDAL CYST EXCISION N/A 05/23/2016   Procedure: CYST EXCISION PILONIDAL EXTENSIVE;  Surgeon: Ancil Linsey, MD;  Location: [facility];  Service: General;  Laterality: N/A;    Review of Systems  Constitutional:  Negative for chills and fever.  Eyes:  Negative for blurred vision.  Respiratory:  Negative for shortness of breath.   Cardiovascular:  Negative for chest pain.  Gastrointestinal:  Negative for abdominal pain.  Genitourinary:  Negative for dysuria.  Neurological:  Negative for dizziness and headaches.      Objective    BP 130/76   Pulse (!) 119   Ht 6\' 1"  (1.854 m)   Wt 256 lb (116.1 kg)   SpO2 98%   BMI 33.78 kg/m   Physical Exam Vitals reviewed.  Constitutional:      General: He is not  in acute distress.    Appearance: Normal appearance. He is not ill-appearing, toxic-appearing or diaphoretic.  HENT:     Head: Normocephalic.     Right Ear: There is impacted cerumen.     Left Ear: There is impacted cerumen.  Eyes:     General:        Right eye: No discharge.        Left eye: No discharge.     Conjunctiva/sclera: Conjunctivae normal.  Cardiovascular:     Rate and Rhythm: Normal rate.     Pulses: Normal pulses.     Heart sounds: Normal heart sounds.  Pulmonary:     Effort: Pulmonary effort is normal. No respiratory distress.     Breath sounds: Normal breath sounds.  Abdominal:     General: Bowel sounds are normal.     Palpations: Abdomen is soft.     Tenderness: There is no abdominal tenderness. There is no guarding.  Musculoskeletal:        General: Normal range of motion.     Cervical back: Normal range of motion.  Skin:    General: Skin is warm and dry.     Capillary Refill: Capillary refill takes less than 2 seconds.  Neurological:     General: No focal deficit present.  Mental Status: He is alert and oriented to person, place, and time.     Coordination: Coordination normal.     Gait: Gait normal.  Psychiatric:        Mood and Affect: Mood normal.        Behavior: Behavior normal.       Assessment & Plan:  Mixed hyperlipidemia Assessment & Plan: LDL Goal <70 Current LDL 113 Labs ordered today. Discussed lifestyle modifications follow diet low in saturated fat, reduce dietary salt intake, avoid fatty foods, maintain an exercise routine 3 to 5 days a week for a minimum total of 150 minutes.    Orders: -     Lipid panel  Vitamin D deficiency -     VITAMIN D 25 Hydroxy (Vit-D Deficiency, Fractures)  Bilateral impacted cerumen Assessment & Plan: Ear wax on both ears. Started Debrox otic solution x 4 days  Orders: -     Carbamide Peroxide; Place 3 drops into both ears 2 (two) times daily. Up to 4 days  Dispense: 15 mL; Refill:  0    Return in about 1 year (around 10/27/2023), or if symptoms worsen or fail to improve, for routine labs, Annual Physical.   Derek [name] Newman Nip, FNP

## 2022-10-27 NOTE — Patient Instructions (Signed)

## 2022-10-27 NOTE — Assessment & Plan Note (Signed)
Ear wax on both ears. Started Debrox otic solution x 4 days

## 2023-01-16 DIAGNOSIS — F411 Generalized anxiety disorder: Secondary | ICD-10-CM | POA: Diagnosis not present

## 2023-03-13 DIAGNOSIS — F411 Generalized anxiety disorder: Secondary | ICD-10-CM | POA: Diagnosis not present

## 2023-05-25 DIAGNOSIS — F411 Generalized anxiety disorder: Secondary | ICD-10-CM | POA: Diagnosis not present

## 2023-07-28 DIAGNOSIS — F411 Generalized anxiety disorder: Secondary | ICD-10-CM | POA: Diagnosis not present

## 2023-10-28 ENCOUNTER — Encounter: Payer: BC Managed Care – PPO | Admitting: Family Medicine

## 2023-10-29 ENCOUNTER — Encounter: Payer: Self-pay | Admitting: Family Medicine

## 2023-10-29 ENCOUNTER — Ambulatory Visit: Payer: BC Managed Care – PPO | Admitting: Family Medicine

## 2023-10-29 VITALS — BP 134/83 | HR 126 | Resp 20 | Ht 73.0 in | Wt 263.0 lb

## 2023-10-29 DIAGNOSIS — E559 Vitamin D deficiency, unspecified: Secondary | ICD-10-CM

## 2023-10-29 DIAGNOSIS — R7301 Impaired fasting glucose: Secondary | ICD-10-CM | POA: Diagnosis not present

## 2023-10-29 DIAGNOSIS — Z0001 Encounter for general adult medical examination with abnormal findings: Secondary | ICD-10-CM | POA: Diagnosis not present

## 2023-10-29 DIAGNOSIS — E782 Mixed hyperlipidemia: Secondary | ICD-10-CM

## 2023-10-29 DIAGNOSIS — E038 Other specified hypothyroidism: Secondary | ICD-10-CM | POA: Diagnosis not present

## 2023-10-29 NOTE — Assessment & Plan Note (Signed)

## 2023-10-29 NOTE — Progress Notes (Signed)
 Complete physical exam  Patient: Derek [name]   DOB: [birth year]   32 y.o. Male  MRN: [id number]  Subjective:    Chief Complaint  Patient presents with   Annual Exam    Derek [name] is a 33 y.o. male who presents today for a complete physical exam. He reports consuming a general diet. The patient does not participate in regular exercise at present. He generally feels well. He reports sleeping well. He does have additional problems to discuss today.    Most recent fall risk assessment:    10/29/2023    1:16 PM  Fall Risk   Falls in the past year? 0  Number falls in past yr: 0  Injury with Fall? 0     Most recent depression screenings:    10/29/2023    1:16 PM 10/27/2022    1:08 PM  PHQ 2/9 Scores  PHQ - 2 Score 0 0  PHQ- 9 Score 2 1    Vision:Not within last year  and Dental: No current dental problems and Receives regular dental care  Patient Care Team: Del Amber Bail, Rogerio Clay, FNP as PCP - General (Family Medicine)   Outpatient Medications Prior to Visit  Medication Sig   cholecalciferol (VITAMIN D3) 25 MCG (1000 UNIT) tablet Take 1,000 Units by mouth daily.   cyclobenzaprine  (FLEXERIL ) 5 MG tablet Take 1 tablet (5 mg total) by mouth 3 (three) times daily as needed for muscle spasms.   escitalopram (LEXAPRO) 10 MG tablet Take 10 mg by mouth daily.   hydrOXYzine  (ATARAX ) 10 MG tablet Take 1 tablet (10 mg total) by mouth 2 (two) times daily.   mupirocin  ointment (BACTROBAN ) 2 % Apply 1 Application topically 2 (two) times daily.   No facility-administered medications prior to visit.    Review of Systems  Constitutional:  Negative for chills and fever.  HENT:  Negative for ear pain.   Eyes:  Negative for blurred vision.  Respiratory:  Negative for shortness of breath.   Cardiovascular:  Negative for chest pain.  Gastrointestinal:  Negative for abdominal pain.  Genitourinary:  Negative for dysuria.  Musculoskeletal:  Negative for myalgias.  Skin:  Negative  for rash.  Neurological:  Negative for dizziness.  Psychiatric/Behavioral:  The patient is not nervous/anxious.        Objective:    BP 134/83   Pulse (!) 126   Resp 20   Ht 6' 1 (1.854 m)   Wt 263 lb (119.3 kg)   SpO2 97%   BMI 34.70 kg/m  BP Readings from Last 3 Encounters:  10/29/23 134/83  10/27/22 130/76  08/27/22 134/84      Physical Exam Vitals reviewed.  Constitutional:      General: He is not in acute distress.    Appearance: Normal appearance. He is not ill-appearing, toxic-appearing or diaphoretic.  HENT:     Head: Normocephalic.     Right Ear: Tympanic membrane normal.     Left Ear: Tympanic membrane normal.   Eyes:     General:        Right eye: No discharge.        Left eye: No discharge.     Conjunctiva/sclera: Conjunctivae normal.    Cardiovascular:     Rate and Rhythm: Normal rate.     Pulses: Normal pulses.     Heart sounds: Normal heart sounds.  Pulmonary:     Effort: Pulmonary effort is normal. No respiratory distress.     Breath sounds:  Normal breath sounds.  Abdominal:     General: Bowel sounds are normal.     Palpations: Abdomen is soft.     Tenderness: There is no abdominal tenderness. There is no right CVA tenderness, left CVA tenderness or guarding.   Musculoskeletal:        General: Normal range of motion.     Cervical back: Normal range of motion.   Skin:    General: Skin is warm and dry.   Neurological:     Mental Status: He is alert.     Coordination: Coordination normal.     Gait: Gait normal.   Psychiatric:        Mood and Affect: Mood normal.        Behavior: Behavior normal.      No results found for any visits on 10/29/23.    Assessment & Plan:    Routine Health Maintenance and Physical Exam  Immunization History  Administered Date(s) Administered   Tdap 05/27/2022    Health Maintenance  Topic Date Due   HPV VACCINES (1 - Male 3-dose series) Never done   COVID-19 Vaccine (1 - 2024-25 season) Never  done   INFLUENZA VACCINE  12/18/2023   DTaP/Tdap/Td (2 - Td or Tdap) 05/27/2032   Hepatitis C Screening  Completed   HIV Screening  Completed   Meningococcal B Vaccine  Aged Out    Discussed health benefits of physical activity, and encouraged him to engage in regular exercise appropriate for his age and condition.  Mixed hyperlipidemia -     BMP8+eGFR -     Lipid panel -     CBC with Differential/Platelet  TSH (thyroid -stimulating hormone deficiency) -     TSH + free T4  IFG (impaired fasting glucose) -     Hemoglobin A1c  Vitamin D  deficiency -     VITAMIN D  25 Hydroxy (Vit-D Deficiency, Fractures)  Encounter for routine adult physical exam with abnormal findings Assessment & Plan: A comprehensive physical examination was completed, and necessary labs were ordered. Screening and health maintenance recommendations have been updated. The patient received counseling on exercise and nutrition. BMI was assessed and discussed Advise for heart health, focus on: Eat more fruits and vegetables: Aim for a variety of colors. Choose whole grains: Brown rice, oats, and whole-wheat bread. Limit unhealthy fats: Avoid trans fats; use olive or avocado oil instead. Include lean proteins: Opt for fish, chicken, beans, and legumes. Reduce sodium: Limit processed foods and add less salt. Stay hydrated: Drink plenty of water. Exercise regularly: Aim for at least 30 minutes of moderate exercise, like walking or cycling, 5 days a week.       Return in about 1 year (around 10/28/2024), or if symptoms worsen or fail to improve, for Annual Physical.     Avelino Lek Amber Bail, FNP

## 2023-10-29 NOTE — Patient Instructions (Signed)

## 2023-10-30 ENCOUNTER — Ambulatory Visit: Payer: Self-pay | Admitting: Family Medicine

## 2023-10-30 LAB — BMP8+EGFR
BUN/Creatinine Ratio: 19 (ref 9–20)
BUN: 14 mg/dL (ref 6–20)
CO2: 20 mmol/L (ref 20–29)
Calcium: 9.8 mg/dL (ref 8.7–10.2)
Chloride: 103 mmol/L (ref 96–106)
Creatinine, Ser: 0.75 mg/dL — ABNORMAL LOW (ref 0.76–1.27)
Glucose: 83 mg/dL (ref 70–99)
Potassium: 4.3 mmol/L (ref 3.5–5.2)
Sodium: 139 mmol/L (ref 134–144)
eGFR: 124 mL/min/{1.73_m2} (ref >59)

## 2023-10-30 LAB — CBC WITH DIFFERENTIAL/PLATELET
Basophils Absolute: 0.1 10*3/uL (ref 0.0–0.2)
Basos: 1 %
EOS (ABSOLUTE): 0.1 10*3/uL (ref 0.0–0.4)
Eos: 1 %
Hematocrit: 48.9 % (ref 37.5–51.0)
Hemoglobin: 15.8 g/dL (ref 13.0–17.7)
Immature Grans (Abs): 0 10*3/uL (ref 0.0–0.1)
Immature Granulocytes: 0 %
Lymphocytes Absolute: 2.3 10*3/uL (ref 0.7–3.1)
Lymphs: 29 %
MCH: 28.8 pg (ref 26.6–33.0)
MCHC: 32.3 g/dL (ref 31.5–35.7)
MCV: 89 fL (ref 79–97)
Monocytes Absolute: 0.7 10*3/uL (ref 0.1–0.9)
Monocytes: 9 %
Neutrophils Absolute: 4.7 10*3/uL (ref 1.4–7.0)
Neutrophils: 59 %
Platelets: 326 10*3/uL (ref 150–450)
RBC: 5.48 x10E6/uL (ref 4.14–5.80)
RDW: 12.8 % (ref 11.6–15.4)
WBC: 8 10*3/uL (ref 3.4–10.8)

## 2023-10-30 LAB — TSH+FREE T4
Free T4: 1.33 ng/dL (ref 0.82–1.77)
TSH: 1.7 u[IU]/mL (ref 0.450–4.500)

## 2023-10-30 LAB — HEMOGLOBIN A1C
Est. average glucose Bld gHb Est-mCnc: 111 mg/dL
Hgb A1c MFr Bld: 5.5 % (ref 4.8–5.6)

## 2023-10-30 LAB — LIPID PANEL
Chol/HDL Ratio: 6.4 ratio — ABNORMAL HIGH (ref 0.0–5.0)
Cholesterol, Total: 198 mg/dL (ref 100–199)
HDL: 31 mg/dL — ABNORMAL LOW (ref >39)
LDL Chol Calc (NIH): 118 mg/dL — ABNORMAL HIGH (ref 0–99)
Triglycerides: 279 mg/dL — ABNORMAL HIGH (ref 0–149)
VLDL Cholesterol Cal: 49 mg/dL — ABNORMAL HIGH (ref 5–40)

## 2023-10-30 LAB — VITAMIN D 25 HYDROXY (VIT D DEFICIENCY, FRACTURES): Vit D, 25-Hydroxy: 8.3 ng/mL — ABNORMAL LOW (ref 30.0–100.0)

## 2024-01-19 DIAGNOSIS — F411 Generalized anxiety disorder: Secondary | ICD-10-CM | POA: Diagnosis not present

## 2024-07-05 ENCOUNTER — Ambulatory Visit: Payer: Self-pay | Admitting: Family Medicine

## 2024-11-04 ENCOUNTER — Encounter: Admitting: Family Medicine
